# Patient Record
Sex: Female | Born: 1940 | Race: White | Hispanic: No | Marital: Married | Smoking: Never smoker
Health system: Southern US, Community
[De-identification: ages and names within clinical notes are randomized; demographics above are authoritative.]

## PROBLEM LIST (undated history)

## (undated) DIAGNOSIS — M069 Rheumatoid arthritis, unspecified: Secondary | ICD-10-CM

## (undated) DIAGNOSIS — E785 Hyperlipidemia, unspecified: Secondary | ICD-10-CM

## (undated) DIAGNOSIS — E291 Testicular hypofunction: Secondary | ICD-10-CM

## (undated) DIAGNOSIS — K589 Irritable bowel syndrome without diarrhea: Secondary | ICD-10-CM

## (undated) DIAGNOSIS — M109 Gout, unspecified: Secondary | ICD-10-CM

## (undated) DIAGNOSIS — N200 Calculus of kidney: Secondary | ICD-10-CM

## (undated) DIAGNOSIS — N400 Enlarged prostate without lower urinary tract symptoms: Secondary | ICD-10-CM

## (undated) DIAGNOSIS — J45909 Unspecified asthma, uncomplicated: Secondary | ICD-10-CM

## (undated) DIAGNOSIS — I10 Essential (primary) hypertension: Secondary | ICD-10-CM

## (undated) DIAGNOSIS — I5030 Unspecified diastolic (congestive) heart failure: Secondary | ICD-10-CM

## (undated) DIAGNOSIS — I2699 Other pulmonary embolism without acute cor pulmonale: Secondary | ICD-10-CM

## (undated) DIAGNOSIS — E559 Vitamin D deficiency, unspecified: Secondary | ICD-10-CM

## (undated) DIAGNOSIS — N183 Chronic kidney disease, stage 3 unspecified: Secondary | ICD-10-CM

## (undated) DIAGNOSIS — K219 Gastro-esophageal reflux disease without esophagitis: Secondary | ICD-10-CM

## (undated) HISTORY — DX: Rheumatoid arthritis, unspecified: M06.9

## (undated) HISTORY — DX: Unspecified asthma, uncomplicated: J45.909

## (undated) HISTORY — DX: Chronic kidney disease, stage 3 (moderate): N18.3

## (undated) HISTORY — DX: Testicular hypofunction: E29.1

## (undated) HISTORY — DX: Irritable bowel syndrome, unspecified: K58.9

## (undated) HISTORY — DX: Vitamin D deficiency, unspecified: E55.9

## (undated) HISTORY — DX: Unspecified diastolic (congestive) heart failure: I50.30

## (undated) HISTORY — DX: Essential (primary) hypertension: I10

## (undated) HISTORY — DX: Other pulmonary embolism without acute cor pulmonale: I26.99

## (undated) HISTORY — DX: Gastro-esophageal reflux disease without esophagitis: K21.9

## (undated) HISTORY — DX: Chronic kidney disease, stage 3 unspecified: N18.30

## (undated) HISTORY — DX: Benign prostatic hyperplasia without lower urinary tract symptoms: N40.0

## (undated) HISTORY — DX: Hyperlipidemia, unspecified: E78.5

## (undated) HISTORY — DX: Gout, unspecified: M10.9

## (undated) HISTORY — DX: Calculus of kidney: N20.0

---

## 1999-06-01 ENCOUNTER — Encounter: Admission: RE | Admit: 1999-06-01 | Discharge: 1999-06-01 | Payer: Self-pay | Admitting: Family Medicine

## 1999-06-01 ENCOUNTER — Encounter: Payer: Self-pay | Admitting: Family Medicine

## 2005-02-02 ENCOUNTER — Encounter: Admission: RE | Admit: 2005-02-02 | Discharge: 2005-02-02 | Payer: Self-pay | Admitting: Family Medicine

## 2005-11-02 ENCOUNTER — Other Ambulatory Visit: Admission: RE | Admit: 2005-11-02 | Discharge: 2005-11-02 | Payer: Self-pay | Admitting: Family Medicine

## 2006-12-25 ENCOUNTER — Encounter: Admission: RE | Admit: 2006-12-25 | Discharge: 2006-12-25 | Payer: Self-pay | Admitting: Family Medicine

## 2009-01-07 ENCOUNTER — Encounter: Admission: RE | Admit: 2009-01-07 | Discharge: 2009-01-07 | Payer: Self-pay | Admitting: Family Medicine

## 2009-01-29 ENCOUNTER — Encounter: Admission: RE | Admit: 2009-01-29 | Discharge: 2009-01-29 | Payer: Self-pay | Admitting: Family Medicine

## 2009-04-13 ENCOUNTER — Emergency Department (HOSPITAL_COMMUNITY): Admission: EM | Admit: 2009-04-13 | Discharge: 2009-04-13 | Payer: Self-pay | Admitting: Emergency Medicine

## 2010-07-04 ENCOUNTER — Encounter: Payer: Self-pay | Admitting: Family Medicine

## 2011-03-14 IMAGING — MG MM DIGITAL SCREENING BILAT W/ CAD
5 series · 5 of 5 positions shown · non-contrast
Comparison: none

DG SCREEN MAMMOGRAM BILATERAL
Bilateral CC and MLO view(s) were taken.

DIGITAL SCREENING MAMMOGRAM WITH CAD:
The breast tissue is heterogeneously dense.  No masses or malignant type calcifications are 
identified.  Compared with prior studies.
Images were processed with CAD.

[R CC]
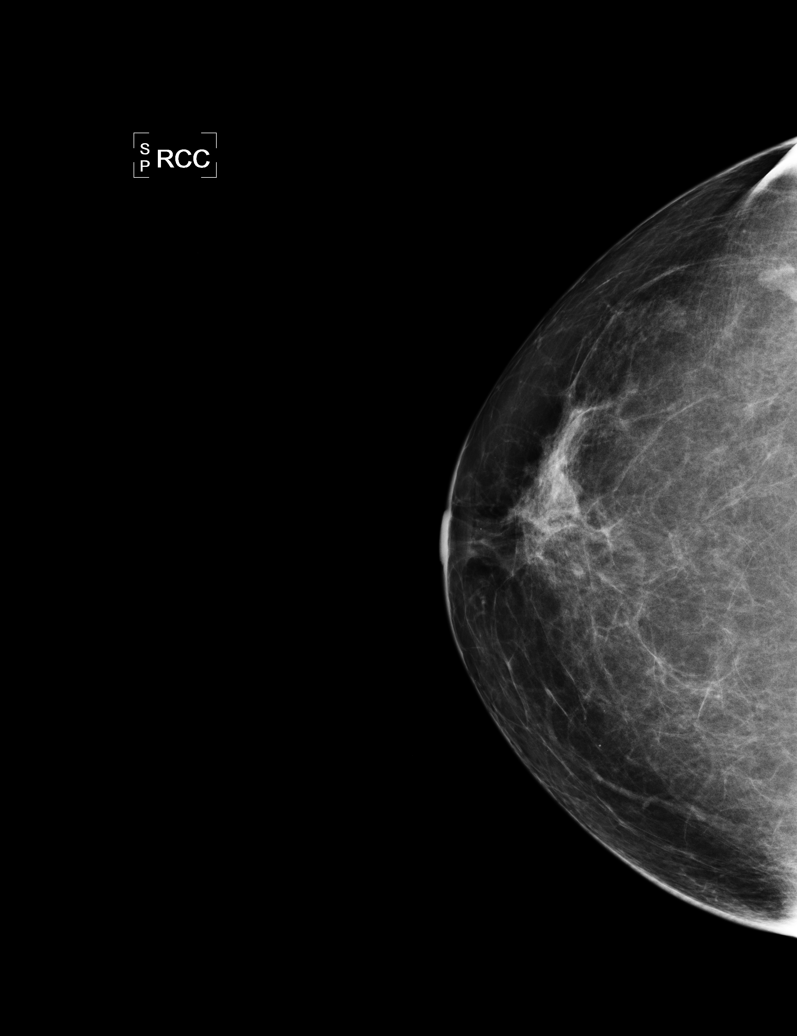

[L CC]
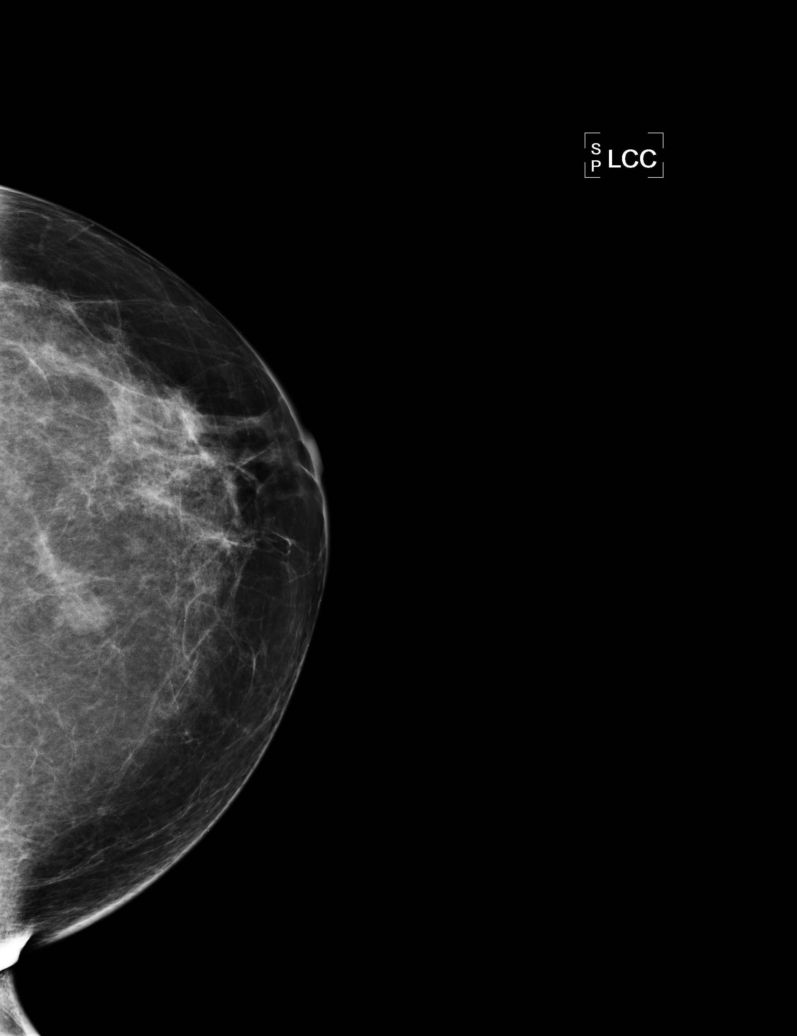

[L MLO]
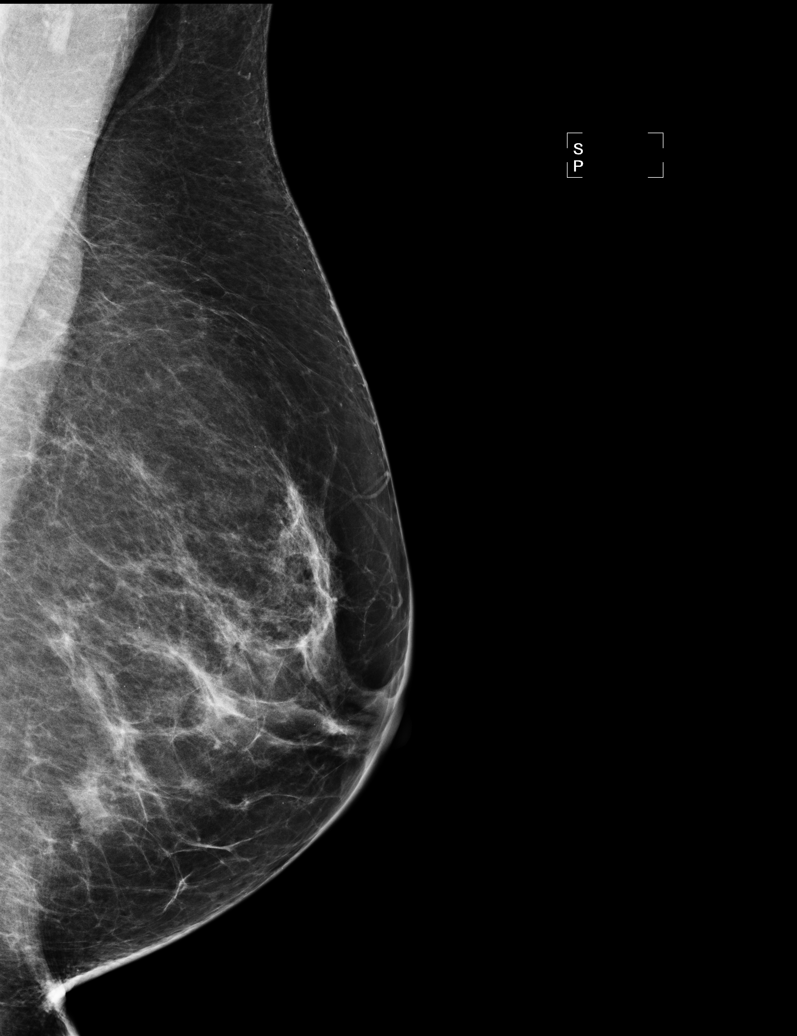

[R MLO]
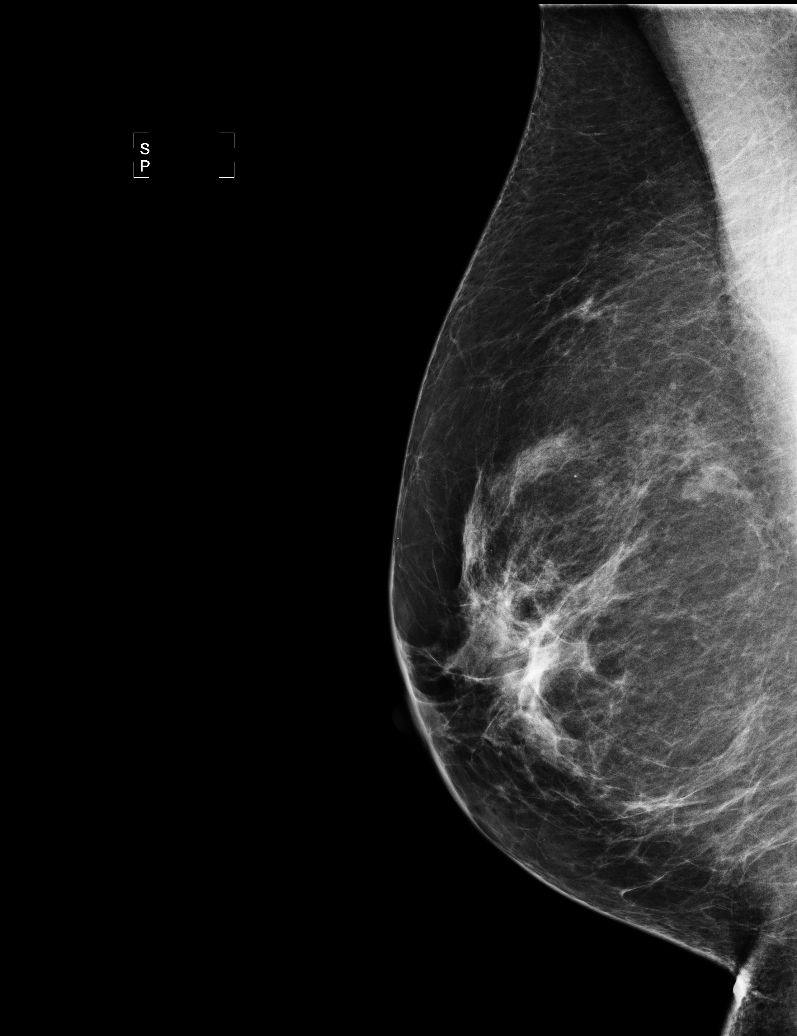

[L XCCL]
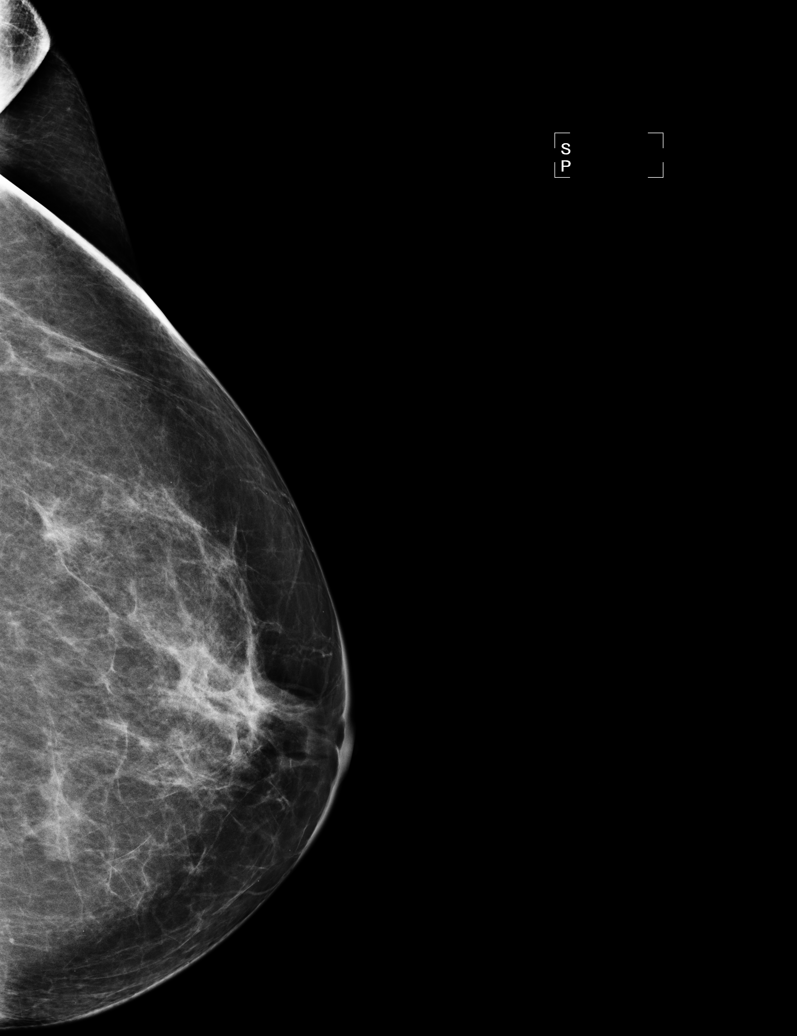

[5 of 5 positions shown; findings below may reference images not displayed]

IMPRESSION: No specific mammographic evidence of malignancy.  Next screening mammogram is recommended in one 
year.

A result letter of this screening mammogram will be mailed directly to the patient.

ASSESSMENT: Negative - BI-RADS 1

Screening mammogram in 1 year.
,

## 2018-04-24 ENCOUNTER — Encounter: Payer: Self-pay | Admitting: Cardiovascular Disease

## 2018-04-25 ENCOUNTER — Encounter (INDEPENDENT_AMBULATORY_CARE_PROVIDER_SITE_OTHER): Payer: Self-pay

## 2018-04-25 ENCOUNTER — Ambulatory Visit: Payer: Medicare HMO | Admitting: Cardiovascular Disease

## 2018-04-25 ENCOUNTER — Encounter: Payer: Self-pay | Admitting: Cardiovascular Disease

## 2018-04-25 VITALS — BP 182/94 | HR 53 | Ht 64.0 in | Wt 129.1 lb

## 2018-04-25 DIAGNOSIS — I10 Essential (primary) hypertension: Secondary | ICD-10-CM

## 2018-04-25 MED ORDER — TELMISARTAN 40 MG PO TABS: 40.0000 mg | ORAL_TABLET | Freq: Every day | ORAL | 11 refills | Status: DC

## 2018-04-25 NOTE — Patient Instructions (Signed)
Medication Instructions:  Your physician has recommended you make the following change in your medication:  1.) stop lisinopril 2.) start telmisartan (Micardis) 40 mg once a day at bedtime  If you need a refill on your cardiac medications before your next appointment, please call your pharmacy.   Lab work: bmet--in 3 weeks (with nurse visit) If you have labs (blood work) drawn today and your tests are completely normal, you will receive your results only by: Marland Kitchen. MyChart Message (if you have MyChart) OR . A paper copy in the mail If you have any lab test that is abnormal or we need to change your treatment, we will call you to review the results.  Testing/Procedures: none  Follow-Up: Your physician recommends that you schedule a follow-up appointment in: 3 weeks with nurse for blood pressure check and lab (BMET) Your physician recommends that you schedule a follow-up appointment in: 3 months with Dr. Elease HashimotoNahser.   Any Other Special Instructions Will Be Listed Below (If Applicable).

## 2018-04-25 NOTE — Progress Notes (Signed)
Cardiology Office Note:    Date:  04/25/2018   ID:  Trilby, Way 01-06-1941, MRN [id number]  PCP:  Lester [place], NP  Cardiologist:  New to [facility]  Electrophysiologist:  None   Referring MD: No ref. provider found   Problem List 1. Essential HTN  Chief Complaint  Patient presents with  . Hypertension     Nov. 14, 2019   Marie Raymond is a 77 y.o. female with a hx of HTN We were asked to see her today by Lester [place], NP for further eval of her HTN  BP has been quite variable .  BP is typically elevated in the AM Gets better as the day goes on  Is on Lisinopril 20 mg BID .   Has tried amlodipine and has tried HCTZ but did not tolerate them for some reason ( cannot remember why )  Watches her salt.   Exercises regularly ,.  Walks every day      Past Medical History:  Diagnosis Date  . Asthma   . Chronic kidney disease, stage III (moderate) (HCC)   . Diastolic heart failure (HCC)   . GERD (gastroesophageal reflux disease)   . Gout   . Hyperlipidemia   . Hypertension   . 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 Irritable bowel syndrome   . Nephrolithiasis   . Prostatic hypertrophy    BENIGN  . Pulmonary embolism (HCC)   . Rheumatoid arthritis (HCC)   . Vitamin D deficiency     History reviewed. No pertinent surgical history.  Current Medications: Current Meds  Medication Sig  . Calcium Carbonate-Vitamin D (OSCAL 500/200 D-3 PO) Take by mouth.  . Multiple Vitamin (MULTIVITAMIN) tablet Take 1 tablet by mouth daily.  . Multiple Vitamins-Minerals (PRESERVISION AREDS PO) Take by mouth.  . Omega-3 Fatty Acids (FISH OIL PO) Take by mouth.  . [DISCONTINUED] lisinopril (PRINIVIL,ZESTRIL) 20 MG tablet Take 20 mg by mouth 2 (two) times daily.     Allergies:   Ibuprofen; Amlodipine; Codeine; Hydrochlorothiazide; Nsaids; and Pneumococcal vaccines   Social History   Socioeconomic History  . Marital status: Married    Spouse name: Not on file  . Number of  children: Not on file  . Years of education: Not on file  . Highest education level: Not on file  Occupational History  . Not on file  Social Needs  . Financial resource strain: Not on file  . Food insecurity:    Worry: Not on file    Inability: Not on file  . Transportation needs:    Medical: Not on file    Non-medical: Not on file  Tobacco Use  . Smoking status: Never Smoker  . Smokeless tobacco: Never Used  Substance and Sexual Activity  . Alcohol use: Never    Frequency: Never  . Drug use: Never  . Sexual activity: Not on file  Lifestyle  . Physical activity:    Days per week: Not on file    Minutes per session: Not on file  . Stress: Not on file  Relationships  . Social connections:    Talks on phone: Not on file    Gets together: Not on file    Attends religious service: Not on file    Active member of club or organization: Not on file    Attends meetings of clubs or organizations: Not on file    Relationship status: Not on file  Other Topics Concern  . Not on file  Social History Narrative  . Not on file     Family History: The patient's family history includes Hypertension in her mother.  ROS:   Please see the history of present illness.     All other systems reviewed and are negative.  EKGs/Labs/Other Studies Reviewed:      EKG:   1114, 2019: Sinus bradycardia at a rate of 53.  Right bundle branch block.  No ST or T wave changes.  Recent Labs: No results found for requested labs within last 8760 hours.  Recent Lipid Panel No results found for: CHOL, TRIG, HDL, CHOLHDL, VLDL, LDLCALC, LDLDIRECT  Physical Exam:    VS:  BP (!) 182/94   Pulse (!) 53   Ht 5\' 4"  (1.626 m)   Wt 129 lb 1.9 oz (58.6 kg)   SpO2 98%   BMI 22.16 kg/m     Wt Readings from Last 3 Encounters:  04/25/18 129 lb 1.9 oz (58.6 kg)     GEN:  Well nourished, well developed in no acute distress HEENT: Normal NECK: No JVD; No carotid bruits LYMPHATICS: No  lymphadenopathy CARDIAC: RRR, no murmurs, rubs, gallops RESPIRATORY:  Clear to auscultation without rales, wheezing or rhonchi  ABDOMEN: Soft, non-tender, non-distended MUSCULOSKELETAL:  No edema; No deformity  SKIN: Warm and dry NEUROLOGIC:  Alert and oriented x 3 PSYCHIATRIC:  Normal affect   ASSESSMENT:    1. Essential hypertension    PLAN:    In order of problems listed above:  1. Essential hypertension: This point presents for further evaluation and management of her hypertension.  Her blood pressure readings have been quite variable.  I explained to her that this was actually normal physiology.  She does have some readings that are a bit on the high side.  I would like to discontinue the lisinopril and start her on Micardis (telmisartan) 40 mg at night   We will have her return in 3 weeks for a basic metabolic profile and a nurse visit blood pressure check.  I will see her in 3 months.   Medication Adjustments/Labs and Tests Ordered: Current medicines are reviewed at length with the patient today.  Concerns regarding medicines are outlined above.  Orders Placed This Encounter  Procedures  . Basic metabolic panel  . EKG 12-Lead   Meds ordered this encounter  Medications  . telmisartan (MICARDIS) 40 MG tablet    Sig: Take 1 tablet (40 mg total) by mouth at bedtime.    Dispense:  30 tablet    Refill:  11    Treatment change; dc lisinopril     Patient Instructions  Medication Instructions:  Your physician has recommended you make the following change in your medication:  1.) stop lisinopril 2.) start telmisartan (Micardis) 40 mg once a day at bedtime  If you need a refill on your cardiac medications before your next appointment, please call your pharmacy.   Lab work: bmet--in 3 weeks (with nurse visit) If you have labs (blood work) drawn today and your tests are completely normal, you will receive your results only by: Marland Kitchen MyChart Message (if you have MyChart)  OR . A paper copy in the mail If you have any lab test that is abnormal or we need to change your treatment, we will call you to review the results.  Testing/Procedures: none  Follow-Up: Your physician recommends that you schedule a follow-up appointment in: 3 weeks with nurse for blood pressure check and lab (BMET) Your physician recommends that you schedule  a follow-up appointment in: 3 months with Dr. Elease HashimotoNahser.   Any Other Special Instructions Will Be Listed Below (If Applicable).       Signed, Kristeen MissPhilip Marton Malizia, MD  04/25/2018 11:33 AM    [facility]

## 2018-05-16 ENCOUNTER — Other Ambulatory Visit: Payer: Medicare HMO | Admitting: *Deleted

## 2018-05-16 ENCOUNTER — Ambulatory Visit (INDEPENDENT_AMBULATORY_CARE_PROVIDER_SITE_OTHER): Payer: Medicare HMO | Admitting: *Deleted

## 2018-05-16 VITALS — BP 184/90 | HR 64 | Wt 131.8 lb

## 2018-05-16 DIAGNOSIS — Z0130 Encounter for examination of blood pressure without abnormal findings: Secondary | ICD-10-CM | POA: Diagnosis not present

## 2018-05-16 DIAGNOSIS — I10 Essential (primary) hypertension: Secondary | ICD-10-CM

## 2018-05-16 LAB — BASIC METABOLIC PANEL
BUN/Creatinine Ratio: 19 (ref 12–28)
BUN: 14 mg/dL (ref 8–27)
CALCIUM: 9 mg/dL (ref 8.7–10.3)
CHLORIDE: 102 mmol/L (ref 96–106)
CO2: 26 mmol/L (ref 20–29)
Creatinine, Ser: 0.72 mg/dL (ref 0.57–1.00)
GFR calc non Af Amer: 81 mL/min/{1.73_m2} (ref >59)
GFR, EST AFRICAN AMERICAN: 93 mL/min/{1.73_m2} (ref >59)
Glucose: 95 mg/dL (ref 65–99)
Potassium: 4.3 mmol/L (ref 3.5–5.2)
Sodium: 142 mmol/L (ref 134–144)

## 2018-05-16 MED ORDER — TELMISARTAN 80 MG PO TABS: 80.0000 mg | ORAL_TABLET | Freq: Every day | ORAL | 11 refills | Status: DC

## 2018-05-16 NOTE — Patient Instructions (Signed)
Medication Instructions:  Your physician has recommended you make the following change in your medication:  1.) increase telmisartan (Micardis) to 80 mg once a day (take at night)  If you need a refill on your cardiac medications before your next appointment, please call your pharmacy.   Lab work: none If you have labs (blood work) drawn today and your tests are completely normal, you will receive your results only by: Marland Kitchen. MyChart Message (if you have MyChart) OR . A paper copy in the mail If you have any lab test that is abnormal or we need to change your treatment, we will call you to review the results.  Testing/Procedures: None  Follow-Up:  Your physician recommends that you schedule a follow-up appointment in: 2 weeks for blood pressure check with the nurse.  Any Other Special Instructions Will Be Listed Below (If Applicable).

## 2018-05-16 NOTE — Progress Notes (Signed)
1.) Reason for visit: BP check  2.) Name of MD requesting visit: Dr. Elease HashimotoNahser  3.) H&P: at last OV 04/25/18 (new pt visit) BP was 182/94.  Lisinopril was stopped and telmisartan 40 mg was added.  4.) ROS related to problem: returns today for BP check and labs. BP today is 184/94, HR 60.  Pt denies fatigue, headache, blurry vision. .  5.) Assessment and plan per MD: Reviewed with Dr. Okey DupreEnd (DOD) who recommends increasing telmisartan to 80 mg once a day at bedtime, see what labs show today, and return in 2 weeks for BP check with nurse.  Pt verbalizes understanding and agreement with this plan.

## 2018-05-30 ENCOUNTER — Ambulatory Visit (INDEPENDENT_AMBULATORY_CARE_PROVIDER_SITE_OTHER): Payer: Medicare HMO

## 2018-05-30 VITALS — BP 122/76 | Ht 64.0 in | Wt 133.0 lb

## 2018-05-30 DIAGNOSIS — I10 Essential (primary) hypertension: Secondary | ICD-10-CM | POA: Diagnosis not present

## 2018-05-30 DIAGNOSIS — Z0130 Encounter for examination of blood pressure without abnormal findings: Secondary | ICD-10-CM

## 2018-05-30 NOTE — Progress Notes (Signed)
1.) Reason for visit: BP  2.) Name of MD requesting visit: Dr. Elease HashimotoNahser   3.) H&P: Hypertension  4.) ROS related to problem: Patient has felt great, no concerns, no incidents of fatigue.   5.) Assessment and plan per MD: Spoke with Dr. Eden EmmsNishan (DOD), he reviewed the patient's blood pressure values and he stated to continue current therapy.

## 2018-07-29 ENCOUNTER — Encounter: Payer: Self-pay | Admitting: Cardiovascular Disease

## 2018-07-29 ENCOUNTER — Ambulatory Visit: Payer: Medicare HMO | Admitting: Cardiovascular Disease

## 2018-07-29 VITALS — BP 125/85 | HR 54 | Ht 64.0 in | Wt 132.8 lb

## 2018-07-29 DIAGNOSIS — I10 Essential (primary) hypertension: Secondary | ICD-10-CM

## 2018-07-29 NOTE — Progress Notes (Signed)
Cardiology Office Note:    Date:  07/29/2018   ID:  Marie Raymond, Marie Raymond July 31, 1940, MRN [id number]  PCP:  Lester [place], NP  Cardiologist:  New to [place]  Electrophysiologist:  None   Referring MD: Lester [place], NP   Problem List 1. Essential HTN  Chief Complaint  Patient presents with  . Hypertension     Nov. 14, 2019   Marie Raymond is a 78 y.o. female with a hx of HTN We were asked to see her today by Lester [place], NP for further eval of her HTN  BP has been quite variable .  BP is typically elevated in the AM Gets better as the day goes on  Is on Lisinopril 20 mg BID .   Has tried amlodipine and has tried HCTZ but did not tolerate them for some reason ( cannot remember why )  Watches her salt.   Exercises regularly ,.  Walks every day    Feb. 17, 2020 :  Marie Raymond presents for further evaluation of her HTN Is doing well  BP is typically slightly elevated in the early am hours .  Exercises regularly .    Eats right.  ,  Avoids salt for the most part.    Past Medical History:  Diagnosis Date  . Asthma   . Chronic kidney disease, stage III (moderate) (HCC)   . Diastolic heart failure (HCC)   . GERD (gastroesophageal reflux disease)   . Gout   . Hyperlipidemia   . Hypertension   . 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 Irritable bowel syndrome   . Nephrolithiasis   . Prostatic hypertrophy    BENIGN  . Pulmonary embolism (HCC)   . Rheumatoid arthritis (HCC)   . Vitamin D deficiency     History reviewed. No pertinent surgical history.  Current Medications: Current Meds  Medication Sig  . Calcium Carbonate-Vitamin D (OSCAL 500/200 D-3 PO) Take by mouth.  . Multiple Vitamin (MULTIVITAMIN) tablet Take 1 tablet by mouth daily.  . Multiple Vitamins-Minerals (PRESERVISION AREDS PO) Take by mouth.  . Omega-3 Fatty Acids (FISH OIL PO) Take by mouth.  . telmisartan (MICARDIS) 80 MG tablet Take 1 tablet (80 mg total) by mouth daily.     Allergies:   Ibuprofen;  Amlodipine; Codeine; Hydrochlorothiazide; Nsaids; and Pneumococcal vaccines   Social History   Socioeconomic History  . Marital status: Married    Spouse name: Not on file  . Number of children: Not on file  . Years of education: Not on file  . Highest education level: Not on file  Occupational History  . Not on file  Social Needs  . Financial resource strain: Not on file  . Food insecurity:    Worry: Not on file    Inability: Not on file  . Transportation needs:    Medical: Not on file    Non-medical: Not on file  Tobacco Use  . Smoking status: Never Smoker  . Smokeless tobacco: Never Used  Substance and Sexual Activity  . Alcohol use: Never    Frequency: Never  . Drug use: Never  . Sexual activity: Not on file  Lifestyle  . Physical activity:    Days per week: Not on file    Minutes per session: Not on file  . Stress: Not on file  Relationships  . Social connections:    Talks on phone: Not on file    Gets together: Not on file    Attends religious  service: Not on file    Active member of club or organization: Not on file    Attends meetings of clubs or organizations: Not on file    Relationship status: Not on file  Other Topics Concern  . Not on file  Social History Narrative  . Not on file     Family History: The patient's family history includes Hypertension in her mother.  ROS:   Please see the history of present illness.     All other systems reviewed and are negative.  EKGs/Labs/Other Studies Reviewed:      EKG:   1114, 2019: Sinus bradycardia at a rate of 53.  Right bundle branch block.  No ST or T wave changes.  Recent Labs: 05/16/2018: BUN 14; Creatinine, Ser 0.72; Potassium 4.3; Sodium 142  Recent Lipid Panel No results found for: CHOL, TRIG, HDL, CHOLHDL, VLDL, LDLCALC, LDLDIRECT   Physical Exam: Blood pressure 125/85, pulse (!) 54, height 5\' 4"  (1.626 m), weight 132 lb 12.8 oz (60.2 kg), SpO2 99 %.  GEN:  Well nourished, well developed  in no acute distress HEENT: Normal NECK: No JVD; No carotid bruits LYMPHATICS: No lymphadenopathy CARDIAC: RRR  RESPIRATORY:  Clear to auscultation without rales, wheezing or rhonchi  ABDOMEN: Soft, non-tender, non-distended MUSCULOSKELETAL:  No edema; No deformity  SKIN: Warm and dry NEUROLOGIC:  Alert and oriented x 3   ASSESSMENT:    No diagnosis found. PLAN:    In order of problems listed above:  1. Essential hypertension:   This program seems to be doing well.  Her blood pressure is fairly well controlled.  Continue telmisartan.  She seems to be doing very well.  I will see her on an as-needed basis.     Medication Adjustments/Labs and Tests Ordered: Current medicines are reviewed at length with the patient today.  Concerns regarding medicines are outlined above.  No orders of the defined types were placed in this encounter.  No orders of the defined types were placed in this encounter.    There are no Patient Instructions on file for this visit.   Signed, Kristeen Miss, MD  07/29/2018 10:16 AM    [facility]

## 2018-07-29 NOTE — Patient Instructions (Signed)
Medication Instructions:  Your physician recommends that you continue on your current medications as directed. Please refer to the Current Medication list given to you today.  If you need a refill on your cardiac medications before your next appointment, please call your pharmacy.   Lab work: None Ordered    Testing/Procedures: None Ordered    Follow-Up: At [facility], you and your health needs are our priority.  As part of our continuing mission to provide you with exceptional heart care, we have created designated Provider Care Teams.  These Care Teams include your primary Cardiologist (physician) and Advanced Practice Providers (APPs -  Physician Assistants and Nurse Practitioners) who all work together to provide you with the care you need, when you need it. You will need a follow up appointment in:   as needed.  Please call our office 2 months in advance to schedule this appointment.  You may see Dr. [place] or one of the following Advanced Practice Providers on your designated Care Team: Scott Weaver, PA-C Vin Bhagat, PA-C . Janine Hammond, NP   

## 2019-06-04 ENCOUNTER — Other Ambulatory Visit: Payer: Self-pay | Admitting: Cardiovascular Disease

## 2019-06-05 ENCOUNTER — Other Ambulatory Visit: Payer: Self-pay | Admitting: *Deleted

## 2019-06-05 MED ORDER — TELMISARTAN 80 MG PO TABS: 80.0000 mg | ORAL_TABLET | Freq: Every day | ORAL | 0 refills | Status: DC

## 2019-09-02 ENCOUNTER — Other Ambulatory Visit: Payer: Self-pay | Admitting: Cardiovascular Disease

## 2020-07-21 ENCOUNTER — Telehealth: Payer: Self-pay | Admitting: Cardiovascular Disease

## 2020-07-21 NOTE — Telephone Encounter (Signed)
Per patient husband who was in for a follow up appointment to see Dr. Shari Prows today  stated that Dr. Elease Hashimoto had released his wife and she need to follow up with her primary care.   She is now having problems and he want her to see Dr. Shari Prows.   Explained to patient that we don't have a note stating the above and I will need to send a note to both MD's to agree to the switch.

## 2020-07-22 NOTE — Telephone Encounter (Signed)
Okay with me 

## 2020-07-22 NOTE — Telephone Encounter (Signed)
I am fine with having the patient see Dr. Shari Prows.

## 2020-07-30 NOTE — Progress Notes (Signed)
Cardiology Office Note:    Date:  08/02/2020   ID:  Marie Raymond, Marie Raymond 07/26/1940, MRN [id number]  PCP:  Lester [place], NP   [facility]  Cardiologist:  Kristeen Miss, MD  Advanced Practice Provider:  No care team member to display Electrophysiologist:  None   Referring MD: Lester Nimmons, NP    History of Present Illness:    Marie Raymond is a 80 y.o. female with a hx of asthma, CKD III, HTN, HLD, rheumatoid arthritis, and GERD who presents to clinic for follow-up of HTN.  The patient was last seen by Dr. Elease Hashimoto in 07/2018 for blood pressure. At that time, she was doing well with her medications, exercise and diet and no changes were made.   Patient states that her blood pressure has been running high for years. Has been on medication for the past 5 years or so. She has noted that over the past several months, her been running high at home in 170s/90s on home cuff. Occasionally has head pressure with this. Having more fatigue at night but is very active during the day. Currently takes her telmisartan 80mg  at night. No chest pain with exertion, DOE, LE edema. Walks everyday for at least everyday. Adheres to a healthy diet and avoids salt.   BMET looks normal. LDL 99, HDL53, TG 130, TC  Past Medical History:  Diagnosis Date  . Asthma   . Chronic kidney disease, stage III (moderate) (HCC)   . Diastolic heart failure (HCC)   . GERD (gastroesophageal reflux disease)   . Gout   . Hyperlipidemia   . Hypertension   . 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 Irritable bowel syndrome   . Nephrolithiasis   . Prostatic hypertrophy    BENIGN  . Pulmonary embolism (HCC)   . Rheumatoid arthritis (HCC)   . Vitamin D deficiency     No past surgical history on file.  Current Medications: Current Meds  Medication Sig  . Calcium Carbonate-Vitamin D (OSCAL 500/200 D-3 PO) Take 2 tablets by mouth daily.  . Multiple Vitamin (MULTIVITAMIN) tablet Take 1 tablet  by mouth daily.  . Multiple Vitamins-Minerals (PRESERVISION AREDS PO) Take 2 tablets by mouth daily.  . Omega-3 Fatty Acids (FISH OIL PO) Take 2 tablets by mouth daily.  235 spironolactone (ALDACTONE) 25 MG tablet Take 1 tablet (25 mg total) by mouth daily.  Marland Kitchen telmisartan (MICARDIS) 80 MG tablet Take 1 tablet (80 mg total) by mouth daily. Further refills to Primary Care Provider     Allergies:   Ibuprofen, Amlodipine, Codeine, Hydrochlorothiazide, Nsaids, and Pneumococcal vaccines   Social History   Socioeconomic History  . Marital status: Married    Spouse name: Not on file  . Number of children: Not on file  . Years of education: Not on file  . Highest education level: Not on file  Occupational History  . Not on file  Tobacco Use  . Smoking status: Never Smoker  . Smokeless tobacco: Never Used  Vaping Use  . Vaping Use: Never used  Substance and Sexual Activity  . Alcohol use: Never  . Drug use: Never  . Sexual activity: Not on file  Other Topics Concern  . Not on file  Social History Narrative  . Not on file   Social Determinants of Health   Financial Resource Strain: Not on file  Food Insecurity: Not on file  Transportation Needs: Not on file  Physical Activity: Not on file  Stress: Not on file  Social Connections: Not on file     Family History: The patient's family history includes Hypertension in her mother.  ROS:   Please see the history of present illness.    Review of Systems  Constitutional: Negative for chills, fever and malaise/fatigue.  HENT: Negative for nosebleeds.   Eyes: Negative for blurred vision and redness.  Respiratory: Negative for shortness of breath.   Cardiovascular: Negative for chest pain, palpitations, orthopnea, claudication, leg swelling and PND.  Gastrointestinal: Negative for nausea and vomiting.  Genitourinary: Negative for dysuria.  Musculoskeletal: Positive for joint pain. Negative for falls.  Neurological: Positive for loss  of consciousness. Negative for dizziness.  Endo/Heme/Allergies: Negative for polydipsia.  Psychiatric/Behavioral: Negative for substance abuse.    EKGs/Labs/Other Studies Reviewed:    The following studies were reviewed today: No cardiac studies in system  EKG:  EKG is  ordered today.  The ekg ordered today demonstrates sinus bradycardia with RBBB. HR 53.  Recent Labs: No results found for requested labs within last 8760 hours.  Recent Lipid Panel No results found for: CHOL, TRIG, HDL, CHOLHDL, VLDL, LDLCALC, LDLDIRECT    Physical Exam:    VS:  BP (!) 164/80 (BP Location: Right Arm, Patient Position: Sitting)   Pulse (!) 53   Ht 5\' 4"  (1.626 m)   Wt 128 lb 12.8 oz (58.4 kg)   SpO2 95%   BMI 22.11 kg/m     Wt Readings from Last 3 Encounters:  08/02/20 128 lb 12.8 oz (58.4 kg)  07/29/18 132 lb 12.8 oz (60.2 kg)  05/30/18 133 lb (60.3 kg)     GEN:  Well nourished, well developed in no acute distress HEENT: Normal NECK: No JVD; No carotid bruits CARDIAC: Bradycardic, regular, no murmurs, rubs, gallops RESPIRATORY:  Clear to auscultation without rales, wheezing or rhonchi  ABDOMEN: Soft, non-tender, non-distended MUSCULOSKELETAL:  No edema; No deformity  SKIN: Warm and dry NEUROLOGIC:  Alert and oriented x 3 PSYCHIATRIC:  Normal affect   ASSESSMENT:    1. Benign essential blepharospasm   2. Benign essential hypertension   3. Mixed hyperlipidemia    PLAN:    In order of problems listed above:  #HTN: Poorly controlled at home with systolic 170s. Correlates with blood pressure obtained today. Has multiple allergies to blood pressure medications including amlodipine and HCTZ. Cannot tolerate BB due to bradycardia. -Continue telmisartan 80mg  daily -Start spironolactone 25mg  daily -Repeat BMET next week -Keep blood pressure log for 06/01/18 to review -On low Na diet  #HLD: LDL 99. Not on medications. Diet controlled. -Diet controlled   Medication Adjustments/Labs  and Tests Ordered: Current medicines are reviewed at length with the patient today.  Concerns regarding medicines are outlined above.  Orders Placed This Encounter  Procedures  . Basic metabolic panel  . EKG 12-Lead   Meds ordered this encounter  Medications  . spironolactone (ALDACTONE) 25 MG tablet    Sig: Take 1 tablet (25 mg total) by mouth daily.    Dispense:  90 tablet    Refill:  3    Patient Instructions  Medication Instructions:  Your physician has recommended you make the following change in your medication:  1.  START Spironolactone 25 taking 1 tablet daily   *If you need a refill on your cardiac medications before your next appointment, please call your pharmacy*   Lab Work: 1 WEEK: Go to a [facility] for:  BMET  If you have labs (blood work) drawn today and  your tests are completely normal, you will receive your results only by: Marland Kitchen MyChart Message (if you have MyChart) OR . A paper copy in the mail If you have any lab test that is abnormal or we need to change your treatment, we will call you to review the results.   Testing/Procedures: None ordered   Follow-Up: At [facility], you and your health needs are our priority.  As part of our continuing mission to provide you with exceptional heart care, we have created designated Provider Care Teams.  These Care Teams include your primary Cardiologist (physician) and Advanced Practice Providers (APPs -  Physician Assistants and Nurse Practitioners) who all work together to provide you with the care you need, when you need it.  We recommend signing up for the patient portal called "MyChart".  Sign up information is provided on this After Visit Summary.  MyChart is used to connect with patients for Virtual Visits (Telemedicine).  Patients are able to view lab/test results, encounter notes, upcoming appointments, etc.  Non-urgent messages can be sent to your provider as well.   To learn more about what you can do with  MyChart, go to [email or web address].    Your next appointment:   1 month(s)  The format for your next appointment:   In Person  Provider:   Laurance Flatten, MD   Other Instructions      Signed, Meriam Sprague, MD  08/02/2020 11:08 AM    [facility]

## 2020-08-02 ENCOUNTER — Other Ambulatory Visit: Payer: Self-pay

## 2020-08-02 ENCOUNTER — Ambulatory Visit: Payer: Medicare PPO | Admitting: Cardiology

## 2020-08-02 ENCOUNTER — Encounter: Payer: Self-pay | Admitting: Cardiology

## 2020-08-02 ENCOUNTER — Other Ambulatory Visit: Payer: Self-pay | Admitting: *Deleted

## 2020-08-02 VITALS — BP 164/80 | HR 53 | Ht 64.0 in | Wt 128.8 lb

## 2020-08-02 DIAGNOSIS — E782 Mixed hyperlipidemia: Secondary | ICD-10-CM

## 2020-08-02 DIAGNOSIS — G245 Blepharospasm: Secondary | ICD-10-CM

## 2020-08-02 DIAGNOSIS — I10 Essential (primary) hypertension: Secondary | ICD-10-CM | POA: Diagnosis not present

## 2020-08-02 MED ORDER — SPIRONOLACTONE 25 MG PO TABS: 25.0000 mg | ORAL_TABLET | Freq: Every day | ORAL | 3 refills | Status: DC

## 2020-08-02 NOTE — Patient Instructions (Addendum)
Medication Instructions:  Your physician has recommended you make the following change in your medication:  1.  START Spironolactone 25 taking 1 tablet daily   *If you need a refill on your cardiac medications before your next appointment, please call your pharmacy*   Lab Work: 1 WEEK: Go to a [facility] for:  BMET  If you have labs (blood work) drawn today and your tests are completely normal, you will receive your results only by: Marland Kitchen MyChart Message (if you have MyChart) OR . A paper copy in the mail If you have any lab test that is abnormal or we need to change your treatment, we will call you to review the results.   Testing/Procedures: None ordered   Follow-Up: At [facility], you and your health needs are our priority.  As part of our continuing mission to provide you with exceptional heart care, we have created designated Provider Care Teams.  These Care Teams include your primary Cardiologist (physician) and Advanced Practice Providers (APPs -  Physician Assistants and Nurse Practitioners) who all work together to provide you with the care you need, when you need it.  We recommend signing up for the patient portal called "MyChart".  Sign up information is provided on this After Visit Summary.  MyChart is used to connect with patients for Virtual Visits (Telemedicine).  Patients are able to view lab/test results, encounter notes, upcoming appointments, etc.  Non-urgent messages can be sent to your provider as well.   To learn more about what you can do with MyChart, go to [email or web address].    Your next appointment:   1 month(s)  The format for your next appointment:   In Person  Provider:   Laurance Flatten, MD   Other Instructions

## 2020-08-10 ENCOUNTER — Telehealth: Payer: Self-pay | Admitting: *Deleted

## 2020-08-10 LAB — BASIC METABOLIC PANEL
BUN/Creatinine Ratio: 19 (ref 12–28)
BUN: 20 mg/dL (ref 8–27)
CO2: 26 mmol/L (ref 20–29)
Calcium: 9.5 mg/dL (ref 8.7–10.3)
Chloride: 102 mmol/L (ref 96–106)
Creatinine, Ser: 1.03 mg/dL — ABNORMAL HIGH (ref 0.57–1.00)
Glucose: 108 mg/dL — ABNORMAL HIGH (ref 65–99)
Potassium: 4.8 mmol/L (ref 3.5–5.2)
Sodium: 142 mmol/L (ref 134–144)
eGFR: 55 mL/min/{1.73_m2} — ABNORMAL LOW (ref >59)

## 2020-08-10 NOTE — Telephone Encounter (Signed)
Spoke with pt and reviewed results.  Pt provided BPs since visit:  2/22- 172/79 AM, 147/60 Afternoon, 142/75 HS 2/23- 142/71 AM, 140/74 Afternoon, 147/77 HS 2/24- 158/77 AM, 149/80 Afternoon, 141/72 HS 2/25- 166/84 AM, 158/75 Afternoon, 140/68 HS 2/26- 153/72 AM, 153/75 Afternoon, 159/80 HS 2/27- 156/79 AM, 153/76 Afternoon, 152/73 HS 2/28- 159/76 AM, 151/75 Afternoon, 146/70 HS  Advised SBP still a little more elevated than we like and I will send to Dr. Shari Prows for review.

## 2020-08-10 NOTE — Telephone Encounter (Signed)
-----   Message from Meriam Sprague, MD sent at 08/10/2020 12:42 PM EST ----- Labs look overall stable. Slight increase in Cr which is expected with spironolactone. How is her blood pressure doing?

## 2020-08-19 NOTE — Telephone Encounter (Signed)
Blood pressures are still high. Unfortunately, due to her allergies, our options in terms of blood pressure medications are limited. Is she willing to take a three times a day medication? If so, can we start her on hydralazine 25mg  TID.  Thank you!

## 2020-08-24 MED ORDER — HYDRALAZINE HCL 25 MG PO TABS: 25.0000 mg | ORAL_TABLET | Freq: Three times a day (TID) | ORAL | 11 refills | Status: DC

## 2020-08-24 NOTE — Telephone Encounter (Signed)
Called patient with Dr. Devin Going recommendations. Patient verbalized understanding and will start taking hydralazine 25 mg TID.

## 2020-08-29 NOTE — Progress Notes (Signed)
Cardiology Office Note:    Date:  08/31/2020   ID:  Catie, Chiao 05-18-1941, MRN [id number]  PCP:  Lester [place], NP   [facility]  Cardiologist:  Kristeen Miss, MD  Advanced Practice Provider:  No care team member to display Electrophysiologist:  None   Referring MD: Lester [place], NP    History of Present Illness:    Marie Raymond is a 80 y.o. female with a hx of asthma, CKD III, HTN, HLD, rheumatoid arthritis, and GERD who presents to clinic for follow-up of HTN.  The patient initially seen by Dr. Elease Hashimoto in 07/2018 for blood pressure. At that time, she was doing well with her medications, exercise and diet and no changes were made.   She returned to clinic 08/02/20 to see me due to labile blood pressures at home with Bps running as high 170s/90s. We started spironolactone in addition to telmisartan at that time. Blood pressure log with persistently high readings in 140s and hydralazine added (has multiple medication intolerances). Now returning for follow-up.  Patient states that she feels a bit more tired since starting the hydralazine. Has occasional headache but that has since improved. Blood pressure is much better controlled at home 120-140s, but she is wondering if there is any adjustments in the medications that can be made to make her feel less tired. No chest pain, SOB, LE edema, PND, or orthopnea. Adheres to low salt diet. Remains active and walks daily with no exertional symptoms.  Past Medical History:  Diagnosis Date  . Asthma   . Chronic kidney disease, stage III (moderate) (HCC)   . Diastolic heart failure (HCC)   . GERD (gastroesophageal reflux disease)   . Gout   . Hyperlipidemia   . Hypertension   . 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 Irritable bowel syndrome   . Nephrolithiasis   . Prostatic hypertrophy    BENIGN  . Pulmonary embolism (HCC)   . Rheumatoid arthritis (HCC)   . Vitamin D deficiency     History reviewed. No  pertinent surgical history.  Current Medications: Current Meds  Medication Sig  . Calcium Carbonate-Vitamin D (OSCAL 500/200 D-3 PO) Take 2 tablets by mouth daily.  . Multiple Vitamin (MULTIVITAMIN) tablet Take 1 tablet by mouth daily.  . Multiple Vitamins-Minerals (PRESERVISION AREDS PO) Take 2 tablets by mouth daily.  . Omega-3 Fatty Acids (FISH OIL PO) Take 2 tablets by mouth daily.  Marland Kitchen telmisartan (MICARDIS) 80 MG tablet Take 1 tablet (80 mg total) by mouth daily. Further refills to Primary Care Provider  . [DISCONTINUED] hydrALAZINE (APRESOLINE) 25 MG tablet Take 1 tablet (25 mg total) by mouth 3 (three) times daily.  . [DISCONTINUED] spironolactone (ALDACTONE) 25 MG tablet Take 1 tablet (25 mg total) by mouth daily.     Allergies:   Ibuprofen, Amlodipine, Codeine, Hydrochlorothiazide, Nsaids, and Pneumococcal vaccines   Social History   Socioeconomic History  . Marital status: Married    Spouse name: Not on file  . Number of children: Not on file  . Years of education: Not on file  . Highest education level: Not on file  Occupational History  . Not on file  Tobacco Use  . Smoking status: Never Smoker  . Smokeless tobacco: Never Used  Vaping Use  . Vaping Use: Never used  Substance and Sexual Activity  . Alcohol use: Never  . Drug use: Never  . Sexual activity: Not on file  Other Topics Concern  .  Not on file  Social History Narrative  . Not on file   Social Determinants of Health   Financial Resource Strain: Not on file  Food Insecurity: Not on file  Transportation Needs: Not on file  Physical Activity: Not on file  Stress: Not on file  Social Connections: Not on file     Family History: The patient's family history includes Hypertension in her mother.  ROS:   Please see the history of present illness.    Review of Systems  Constitutional: Positive for malaise/fatigue. Negative for chills and fever.  HENT: Negative for hearing loss.   Eyes: Negative for  blurred vision and redness.  Respiratory: Negative for shortness of breath.   Cardiovascular: Negative for chest pain, palpitations, orthopnea, claudication, leg swelling and PND.  Gastrointestinal: Negative for heartburn, melena and nausea.  Genitourinary: Negative for dysuria.  Musculoskeletal: Negative for falls and myalgias.  Neurological: Negative for dizziness and loss of consciousness.  Endo/Heme/Allergies: Negative for polydipsia.  Psychiatric/Behavioral: Negative for substance abuse.    EKGs/Labs/Other Studies Reviewed:    The following studies were reviewed today: No cardiac studies  Recent Labs: 08/09/2020: BUN 20; Creatinine, Ser 1.03; Potassium 4.8; Sodium 142  Recent Lipid Panel No results found for: CHOL, TRIG, HDL, CHOLHDL, VLDL, LDLCALC, LDLDIRECT    Physical Exam:    VS:  BP (!) 150/80   Pulse 65   Ht 5\' 4"  (1.626 m)   Wt 129 lb 3.2 oz (58.6 kg)   SpO2 99%   BMI 22.18 kg/m     Wt Readings from Last 3 Encounters:  08/31/20 129 lb 3.2 oz (58.6 kg)  08/02/20 128 lb 12.8 oz (58.4 kg)  07/29/18 132 lb 12.8 oz (60.2 kg)     GEN:  Well nourished, well developed in no acute distress HEENT: Normal NECK: No JVD; No carotid bruits CARDIAC: RRR, no murmurs, rubs, gallops RESPIRATORY:  Clear to auscultation without rales, wheezing or rhonchi  ABDOMEN: Soft, non-tender, non-distended MUSCULOSKELETAL:  No edema; No deformity  SKIN: Warm and dry NEUROLOGIC:  Alert and oriented x 3 PSYCHIATRIC:  Normal affect   ASSESSMENT:    1. Hypertension, unspecified type   2. Mixed hyperlipidemia    PLAN:    In order of problems listed above:  #HTN: Better controlled. Has multiple allergies to blood pressure medications including amlodipine and HCTZ. Cannot tolerate BB due to bradycardia. Hydralazine has made her feel tired with occasional HA.  -Continue telmisartan 80mg  in the morning -Increase spironolactone to 25mg  in the AM (BP is highest in late morning and  afternoon) and 25mg  in the evening -Check BMET next week as may not be able to tolerate higher spiro dose due to K -Stop hydralazine for now due to fatigue and HA; willing to re-trial if spironolactone does not work -Keep blood pressure log for 07/31/18 to review -On low Na diet  #HLD: LDL 99. Not on medications. Diet controlled. -Diet controlled   Medication Adjustments/Labs and Tests Ordered: Current medicines are reviewed at length with the patient today.  Concerns regarding medicines are outlined above.  Orders Placed This Encounter  Procedures  . Basic metabolic panel   Meds ordered this encounter  Medications  . spironolactone (ALDACTONE) 25 MG tablet    Sig: Take 1 tablet (25 mg total) by mouth 2 (two) times daily.    Dispense:  180 tablet    Refill:  3    Patient Instructions  Medication Instructions:  1) STOP HYDRALAZINE 2) TAKE SPIRONOLACTONE  25 mg twice daily *If you need a refill on your cardiac medications before your next appointment, please call your pharmacy*  Lab Work: Please have labs drawn in about 1 week. You do NOT need to be fasting.  Follow-Up: Your provider recommends that you schedule a follow-up appointment in 4 weeks.    Signed, Meriam Sprague, MD  08/31/2020 1:08 PM    [facility]

## 2020-08-31 ENCOUNTER — Ambulatory Visit: Payer: Medicare PPO | Admitting: Cardiology

## 2020-08-31 ENCOUNTER — Other Ambulatory Visit: Payer: Self-pay

## 2020-08-31 ENCOUNTER — Encounter: Payer: Self-pay | Admitting: Cardiology

## 2020-08-31 VITALS — BP 150/80 | HR 65 | Ht 64.0 in | Wt 129.2 lb

## 2020-08-31 DIAGNOSIS — I10 Essential (primary) hypertension: Secondary | ICD-10-CM

## 2020-08-31 DIAGNOSIS — E782 Mixed hyperlipidemia: Secondary | ICD-10-CM

## 2020-08-31 MED ORDER — SPIRONOLACTONE 25 MG PO TABS: 25.0000 mg | ORAL_TABLET | Freq: Two times a day (BID) | ORAL | 3 refills | Status: DC

## 2020-08-31 NOTE — Patient Instructions (Signed)
Medication Instructions:  1) STOP HYDRALAZINE 2) TAKE SPIRONOLACTONE  25 mg twice daily *If you need a refill on your cardiac medications before your next appointment, please call your pharmacy*  Lab Work: Please have labs drawn in about 1 week. You do NOT need to be fasting.  Follow-Up: Your provider recommends that you schedule a follow-up appointment in 4 weeks.

## 2020-09-08 LAB — BASIC METABOLIC PANEL
BUN/Creatinine Ratio: 23 (ref 12–28)
BUN: 25 mg/dL (ref 8–27)
CO2: 25 mmol/L (ref 20–29)
Calcium: 9.5 mg/dL (ref 8.7–10.3)
Chloride: 98 mmol/L (ref 96–106)
Creatinine, Ser: 1.11 mg/dL — ABNORMAL HIGH (ref 0.57–1.00)
Glucose: 198 mg/dL — ABNORMAL HIGH (ref 65–99)
Potassium: 4.5 mmol/L (ref 3.5–5.2)
Sodium: 138 mmol/L (ref 134–144)
eGFR: 51 mL/min/{1.73_m2} — ABNORMAL LOW (ref >59)

## 2020-09-25 NOTE — Progress Notes (Deleted)
Cardiology Office Note:    Date:  09/25/2020   ID:  Marie Raymond, Marie Raymond 06/13/1940, MRN [id number]  PCP:  Lester [place], NP   [facility]  Cardiologist:  Kristeen Miss, MD  Advanced Practice Provider:  No care team member to display Electrophysiologist:  None   Referring MD: Lester [place], NP    History of Present Illness:    Marie Raymond is a 80 y.o. female with a hx of asthma, CKD III, HTN, HLD, rheumatoid arthritis, and GERD who presents to clinic for follow-up of HTN.  The patient initially seen by Dr. Elease Hashimoto in 07/2018 for blood pressure. At that time, she was doing well with her medications, exercise and diet and no changes were made.   She returned to clinic 08/02/20 to see me due to labile blood pressures at home with Bps running as high 170s/90s. We started spironolactone in addition to telmisartan at that time. Blood pressure log with persistently high readings in 140s and hydralazine added (has multiple medication intolerances). Now returning for follow-up.  During last visit on 08/31/20, the patient was complaining of fatigue on hydralazine. Blood pressures were better controlled but she was hoping to trial off the medication. We increased her spirolactone to 25mg  BID and stopped the hydralazine. She now returns to clinic.  Today,  Past Medical History:  Diagnosis Date  . Asthma   . Chronic kidney disease, stage III (moderate) (HCC)   . Diastolic heart failure (HCC)   . GERD (gastroesophageal reflux disease)   . Gout   . Hyperlipidemia   . Hypertension   . 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 Irritable bowel syndrome   . Nephrolithiasis   . Prostatic hypertrophy    BENIGN  . Pulmonary embolism (HCC)   . Rheumatoid arthritis (HCC)   . Vitamin D deficiency     No past surgical history on file.  Current Medications: No outpatient medications have been marked as taking for the 09/30/20 encounter (Appointment) with 10/02/20, MD.      Allergies:   Ibuprofen, Amlodipine, Codeine, Hydrochlorothiazide, Nsaids, and Pneumococcal vaccines   Social History   Socioeconomic History  . Marital status: Married    Spouse name: Not on file  . Number of children: Not on file  . Years of education: Not on file  . Highest education level: Not on file  Occupational History  . Not on file  Tobacco Use  . Smoking status: Never Smoker  . Smokeless tobacco: Never Used  Vaping Use  . Vaping Use: Never used  Substance and Sexual Activity  . Alcohol use: Never  . Drug use: Never  . Sexual activity: Not on file  Other Topics Concern  . Not on file  Social History Narrative  . Not on file   Social Determinants of Health   Financial Resource Strain: Not on file  Food Insecurity: Not on file  Transportation Needs: Not on file  Physical Activity: Not on file  Stress: Not on file  Social Connections: Not on file     Family History: The patient's family history includes Hypertension in her mother.  ROS:   Please see the history of present illness.    Review of Systems  Constitutional: Positive for malaise/fatigue. Negative for chills and fever.  HENT: Negative for hearing loss.   Eyes: Negative for blurred vision and redness.  Respiratory: Negative for shortness of breath.   Cardiovascular: Negative for chest pain, palpitations, orthopnea, claudication, leg  swelling and PND.  Gastrointestinal: Negative for heartburn, melena and nausea.  Genitourinary: Negative for dysuria.  Musculoskeletal: Negative for falls and myalgias.  Neurological: Negative for dizziness and loss of consciousness.  Endo/Heme/Allergies: Negative for polydipsia.  Psychiatric/Behavioral: Negative for substance abuse.    EKGs/Labs/Other Studies Reviewed:    The following studies were reviewed today: No cardiac studies  Recent Labs: 09/07/2020: BUN 25; Creatinine, Ser 1.11; Potassium 4.5; Sodium 138  Recent Lipid Panel No results found for:  CHOL, TRIG, HDL, CHOLHDL, VLDL, LDLCALC, LDLDIRECT    Physical Exam:    VS:  There were no vitals taken for this visit.    Wt Readings from Last 3 Encounters:  08/31/20 129 lb 3.2 oz (58.6 kg)  08/02/20 128 lb 12.8 oz (58.4 kg)  07/29/18 132 lb 12.8 oz (60.2 kg)     GEN:  Well nourished, well developed in no acute distress HEENT: Normal NECK: No JVD; No carotid bruits CARDIAC: RRR, no murmurs, rubs, gallops RESPIRATORY:  Clear to auscultation without rales, wheezing or rhonchi  ABDOMEN: Soft, non-tender, non-distended MUSCULOSKELETAL:  No edema; No deformity  SKIN: Warm and dry NEUROLOGIC:  Alert and oriented x 3 PSYCHIATRIC:  Normal affect   ASSESSMENT:    No diagnosis found. PLAN:    In order of problems listed above:  #HTN: Better controlled. Has multiple allergies to blood pressure medications including amlodipine and HCTZ. Cannot tolerate BB due to bradycardia. Hydralazine made her feel tired with occasional HA and we are trialing off the medication. -Continue telmisartan 80mg  in the morning -Continue spironolactone to 25mg  in the AM (BP is highest in late morning and afternoon) and 25mg  in the evening -Stopped hydralazine for now due to fatigue and HA; willing to re-trial if spironolactone does not work -Keep blood pressure log for to review -On low Na diet  #HLD: LDL 99. Not on medications. Diet controlled. -Diet controlled   Medication Adjustments/Labs and Tests Ordered: Current medicines are reviewed at length with the patient today.  Concerns regarding medicines are outlined above.  No orders of the defined types were placed in this encounter.  No orders of the defined types were placed in this encounter.   There are no Patient Instructions on file for this visit.   Signed, , MD  09/25/2020 2:15 PM    [facility]

## 2020-09-30 ENCOUNTER — Other Ambulatory Visit: Payer: Self-pay

## 2020-09-30 ENCOUNTER — Encounter: Payer: Self-pay | Admitting: Cardiology

## 2020-09-30 ENCOUNTER — Ambulatory Visit: Payer: Medicare PPO | Admitting: Cardiology

## 2020-09-30 VITALS — BP 132/80 | HR 65 | Ht 64.5 in | Wt 127.0 lb

## 2020-09-30 DIAGNOSIS — I10 Essential (primary) hypertension: Secondary | ICD-10-CM | POA: Diagnosis not present

## 2020-09-30 DIAGNOSIS — R739 Hyperglycemia, unspecified: Secondary | ICD-10-CM | POA: Diagnosis not present

## 2020-09-30 DIAGNOSIS — E782 Mixed hyperlipidemia: Secondary | ICD-10-CM | POA: Diagnosis not present

## 2020-09-30 NOTE — Progress Notes (Signed)
Cardiology Office Note:    Date:  09/30/2020   ID:  Marie, Raymond 19-Apr-1941, MRN [id number]  PCP:  Lester McLean, NP   [facility]  Cardiologist:  Kristeen Miss, MD  Advanced Practice Provider:  No care team member to display Electrophysiologist:  None   Referring MD: Lester Tarnov, NP    History of Present Illness:    Marie Raymond is a 80 y.o. female with a hx of asthma, CKD III, HTN, HLD, rheumatoid arthritis, and GERD who presents to clinic for follow-up of HTN.  The patient initially seen by Dr. Elease Hashimoto in 07/2018 for blood pressure. At that time, she was doing well with her medications, exercise and diet and no changes were made.   She returned to clinic 08/02/20 to see me due to labile blood pressures at home with Bps running as high 170s/90s. We started spironolactone in addition to telmisartan at that time. Blood pressure log with persistently high readings in 140s and hydralazine added (has multiple medication intolerances). Now returning for follow-up.  During last visit on 08/31/20, the patient was complaining of fatigue on hydralazine. Blood pressures were better controlled but she was hoping to trial off the medication. We increased her spirolactone to 25mg  BID and stopped the hydralazine. She now returns to clinic.  Today, she is accompanied by her husband and reports that she is doing well at this time. She is tolerating her spironolactone 25mg  BID. Her blood pressure ranges from 120s/60-70s consistently now.She is still having headaches but she thinks it is related to increasing her sugar intake.  She is still active and has no exertional SOB, chest pain, tightness, or pressure. She denies having any PND, orthopnea, lightheadedness, nausea, or LE swelling.   Past Medical History:  Diagnosis Date  . Asthma   . Chronic kidney disease, stage III (moderate) (HCC)   . Diastolic heart failure (HCC)   . GERD (gastroesophageal reflux  disease)   . Gout   . Hyperlipidemia   . Hypertension   . 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 Irritable bowel syndrome   . Nephrolithiasis   . Prostatic hypertrophy    BENIGN  . Pulmonary embolism (HCC)   . Rheumatoid arthritis (HCC)   . Vitamin D deficiency     History reviewed. No pertinent surgical history.  Current Medications: Current Meds  Medication Sig  . Calcium Carbonate-Vitamin D (OSCAL 500/200 D-3 PO) Take 2 tablets by mouth daily.  . Multiple Vitamin (MULTIVITAMIN) tablet Take 1 tablet by mouth daily.  . Multiple Vitamins-Minerals (PRESERVISION AREDS PO) Take 2 tablets by mouth daily.  . Omega-3 Fatty Acids (FISH OIL PO) Take 2 tablets by mouth daily.  spironolactone (ALDACTONE) 25 MG tablet Take 1 tablet (25 mg total) by mouth 2 (two) times daily.  telmisartan (MICARDIS) 80 MG tablet Take 1 tablet (80 mg total) by mouth daily. Further refills to Primary Care Provider     Allergies:   Ibuprofen, Amlodipine, Codeine, Hydrochlorothiazide, Nsaids, and Pneumococcal vaccines   Social History   Socioeconomic History  . Marital status: Married    Spouse name: Not on file  . Number of children: Not on file  . Years of education: Not on file  . Highest education level: Not on file  Occupational History  . Not on file  Tobacco Use  . Smoking status: Never Smoker  . Smokeless tobacco: Never Used  Vaping Use  . Vaping Use: Never used  Substance and  Sexual Activity  . Alcohol use: Never  . Drug use: Never  . Sexual activity: Not on file  Other Topics Concern  . Not on file  Social History Narrative  . Not on file   Social Determinants of Health   Financial Resource Strain: Not on file  Food Insecurity: Not on file  Transportation Needs: Not on file  Physical Activity: Not on file  Stress: Not on file  Social Connections: Not on file     Family History: The patient's family history includes Hypertension in her mother.  ROS:   Please see the history of present  illness.    Review of Systems  Constitutional: Negative for chills, fever and malaise/fatigue.  HENT: Negative for hearing loss.   Eyes: Negative for blurred vision and redness.  Respiratory: Negative for shortness of breath.   Cardiovascular: Negative for chest pain, palpitations, orthopnea, claudication, leg swelling and PND.  Gastrointestinal: Negative for heartburn, melena and nausea.  Genitourinary: Negative for dysuria.  Musculoskeletal: Negative for falls and myalgias.  Neurological: Negative for dizziness and loss of consciousness.  Endo/Heme/Allergies: Negative for polydipsia.  Psychiatric/Behavioral: Negative for substance abuse.    EKGs/Labs/Other Studies Reviewed:    EKG:  09/30/20-EKG was not ordered today  Recent Labs: 09/07/2020: BUN 25; Creatinine, Ser 1.11; Potassium 4.5; Sodium 138  Recent Lipid Panel No results found for: CHOL, TRIG, HDL, CHOLHDL, VLDL, LDLCALC, LDLDIRECT    Physical Exam:    VS:  BP 132/80   Pulse 65   Ht 5' 4.5" (1.638 m)   Wt 127 lb (57.6 kg)   SpO2 99%   BMI 21.46 kg/m     Wt Readings from Last 3 Encounters:  09/30/20 127 lb (57.6 kg)  08/31/20 129 lb 3.2 oz (58.6 kg)  08/02/20 128 lb 12.8 oz (58.4 kg)     GEN:  Well nourished, well developed in no acute distress HEENT: Normal NECK: No JVD; No carotid bruits CARDIAC: RRR, no murmurs, rubs, gallops RESPIRATORY:  Clear to auscultation without rales, wheezing or rhonchi  ABDOMEN: Soft, non-tender, non-distended MUSCULOSKELETAL:  No edema; No deformity  SKIN: Warm and dry NEUROLOGIC:  Alert and oriented x 3 PSYCHIATRIC:  Normal affect   ASSESSMENT:    1. Hypertension, unspecified type   2. Elevated blood sugar   3. Mixed hyperlipidemia    PLAN:    In order of problems listed above:  #HTN: Controlled. Has multiple allergies to blood pressure medications including amlodipine and HCTZ. Cannot tolerate BB due to bradycardia. Hydralazine made her feel tired with occasional  HA. -Continue telmisartan 80mg  in the morning -Continue spironolactone to 25mg  in the AM (BP is highest in late morning and afternoon) and 25mg  in the evening -Stopped hydralazine for now due to fatigue and HA; willing to re-trial if spironolactone does not work -On low Na diet -Repeat BMET   #HLD: LDL 99. Not on medications. Diet controlled. -Diet controlled -Repeat lipids  #Imparied fasting glucose: -Check A1C -Has PCP follow-up in September   Medication Adjustments/Labs and Tests Ordered: Current medicines are reviewed at length with the patient today.  Concerns regarding medicines are outlined above.  Orders Placed This Encounter  Procedures  . HgB A1c  . Basic metabolic panel  . Lipid Profile   No orders of the defined types were placed in this encounter.   Patient Instructions  Medication Instructions:   Your physician recommends that you continue on your current medications as directed. Please refer to the Current Medication list given  to you today.  *If you need a refill on your cardiac medications before your next appointment, please call your pharmacy*   Lab Work:  IN THE VERY NEAR FUTURE AT ANY [facility]--THEY WILL CHECK BMET, HEMOGLOBIN A1C, AND LIPIDS--PLEASE GO FASTING TO THAT LAB APPOINTMENT  If you have labs (blood work) drawn today and your tests are completely normal, you will receive your results only by: Marland Kitchen MyChart Message (if you have MyChart) OR . A paper copy in the mail If you have any lab test that is abnormal or we need to change your treatment, we will call you to review the results.   Follow-Up: At [facility], you and your health needs are our priority.  As part of our continuing mission to provide you with exceptional heart care, we have created designated Provider Care Teams.  These Care Teams include your primary Cardiologist (physician) and Advanced Practice Providers (APPs -  Physician Assistants and Nurse Practitioners) who all work  together to provide you with the care you need, when you need it.  We recommend signing up for the patient portal called "MyChart".  Sign up information is provided on this After Visit Summary.  MyChart is used to connect with patients for Virtual Visits (Telemedicine).  Patients are able to view lab/test results, encounter notes, upcoming appointments, etc.  Non-urgent messages can be sent to your provider as well.   To learn more about what you can do with MyChart, go to [email or web address].    Your next appointment:   1 year(s)  The format for your next appointment:   In Person  Provider:   Laurance Flatten, MD         I,Alexis Bryant,acting as a scribe for Meriam Sprague, MD.,have documented all relevant documentation on the behalf of Meriam Sprague, MD,as directed by  Meriam Sprague, MD while in the presence of Meriam Sprague, MD.  I, Meriam Sprague, MD, have reviewed all documentation for this visit. The documentation on 09/30/20 for the exam, diagnosis, procedures, and orders are all accurate and complete.  Signed, Meriam Sprague, MD  09/30/2020 8:34 AM    [facility]

## 2020-09-30 NOTE — Patient Instructions (Signed)
Medication Instructions:   Your physician recommends that you continue on your current medications as directed. Please refer to the Current Medication list given to you today.  *If you need a refill on your cardiac medications before your next appointment, please call your pharmacy*   Lab Work:  IN THE VERY NEAR FUTURE AT ANY [facility]--THEY WILL CHECK BMET, HEMOGLOBIN A1C, AND LIPIDS--PLEASE GO FASTING TO THAT LAB APPOINTMENT  If you have labs (blood work) drawn today and your tests are completely normal, you will receive your results only by: Marland Kitchen MyChart Message (if you have MyChart) OR . A paper copy in the mail If you have any lab test that is abnormal or we need to change your treatment, we will call you to review the results.   Follow-Up: At [facility], you and your health needs are our priority.  As part of our continuing mission to provide you with exceptional heart care, we have created designated Provider Care Teams.  These Care Teams include your primary Cardiologist (physician) and Advanced Practice Providers (APPs -  Physician Assistants and Nurse Practitioners) who all work together to provide you with the care you need, when you need it.  We recommend signing up for the patient portal called "MyChart".  Sign up information is provided on this After Visit Summary.  MyChart is used to connect with patients for Virtual Visits (Telemedicine).  Patients are able to view lab/test results, encounter notes, upcoming appointments, etc.  Non-urgent messages can be sent to your provider as well.   To learn more about what you can do with MyChart, go to [email or web address].    Your next appointment:   1 year(s)  The format for your next appointment:   In Person  Provider:   Laurance Flatten, MD

## 2020-10-02 LAB — BASIC METABOLIC PANEL
BUN/Creatinine Ratio: 22 (ref 12–28)
BUN: 26 mg/dL (ref 8–27)
CO2: 23 mmol/L (ref 20–29)
Calcium: 9.1 mg/dL (ref 8.7–10.3)
Chloride: 97 mmol/L (ref 96–106)
Creatinine, Ser: 1.17 mg/dL — ABNORMAL HIGH (ref 0.57–1.00)
Glucose: 108 mg/dL — ABNORMAL HIGH (ref 65–99)
Potassium: 5.1 mmol/L (ref 3.5–5.2)
Sodium: 134 mmol/L (ref 134–144)
eGFR: 47 mL/min/{1.73_m2} — ABNORMAL LOW (ref >59)

## 2020-10-02 LAB — LIPID PANEL
Chol/HDL Ratio: 4.3 ratio (ref 0.0–4.4)
Cholesterol, Total: 172 mg/dL (ref 100–199)
HDL: 40 mg/dL (ref >39)
LDL Chol Calc (NIH): 115 mg/dL — ABNORMAL HIGH (ref 0–99)
Triglycerides: 93 mg/dL (ref 0–149)
VLDL Cholesterol Cal: 17 mg/dL (ref 5–40)

## 2020-10-02 LAB — HEMOGLOBIN A1C
Est. average glucose Bld gHb Est-mCnc: 137 mg/dL
Hgb A1c MFr Bld: 6.4 % — ABNORMAL HIGH (ref 4.8–5.6)

## 2020-10-04 ENCOUNTER — Telehealth: Payer: Self-pay | Admitting: *Deleted

## 2020-10-04 DIAGNOSIS — E782 Mixed hyperlipidemia: Secondary | ICD-10-CM

## 2020-10-04 DIAGNOSIS — Z79899 Other long term (current) drug therapy: Secondary | ICD-10-CM

## 2020-10-04 DIAGNOSIS — E7800 Pure hypercholesterolemia, unspecified: Secondary | ICD-10-CM

## 2020-10-04 DIAGNOSIS — I10 Essential (primary) hypertension: Secondary | ICD-10-CM

## 2020-10-04 DIAGNOSIS — R739 Hyperglycemia, unspecified: Secondary | ICD-10-CM

## 2020-10-04 NOTE — Telephone Encounter (Signed)
-----   Message from Meriam Sprague, MD sent at 10/03/2020 11:28 PM EDT ----- Her Cr and electrolytes are overall stable. We need to ensure she is staying well hydrated as her potassium is on the higher end and we want to ensure her GFR remains stable.  Her LDL is a little above goal at 115 (her goal is <100). We can do a 3 month trial of dietary and lifestyle changes and re-check afterwards. If still elevated, would recommend a low dose crestor 5mg  daily.  Her hemoglobin A1C is 6.4. She is prediabetic and we will really need to watch this going forward. Again, diet and lifestyle changes including watching her sugar/carb intake will help prevent this from worsening.   Can we repeat cholesterol and BMET in 3 months? Just want to make sure we are where we need to be.

## 2020-10-04 NOTE — Telephone Encounter (Signed)
Pt made aware of lab results and recommendations per Dr. Shari Prows.  Pt education provided about diet and lifestyle changes to help lower her LDL.  Pt advised to hydrate better.  Pt will have labs redrawn to check a BMET and lipids in 3 months, at [place] [facility] near her.  Orders placed and released in the system. Pt is aware to go fasting to that lab appt at [facility] around 7/25, to have these checked.  She is requesting a copy of her results to be mailed to her address on file.  Will mail these out.  Pt verbalized understanding and agrees with this plan.

## 2020-10-07 ENCOUNTER — Encounter: Payer: Self-pay | Admitting: *Deleted

## 2021-01-20 ENCOUNTER — Telehealth: Payer: Self-pay | Admitting: Cardiology

## 2021-01-20 MED ORDER — TELMISARTAN 80 MG PO TABS: 80.0000 mg | ORAL_TABLET | Freq: Every day | ORAL | 3 refills | Status: DC

## 2021-01-20 NOTE — Telephone Encounter (Signed)
*  STAT* If patient is at the pharmacy, call can be transferred to refill team.   1. Which medications need to be refilled? (please list name of each medication and dose if known) new prescriptions- the doctor who wrote these prescriptions have retired-  Telmisartan and Spironolactone  2. Which pharmacy/location (including street and city if local pharmacy) is medication to be sent to? [facility] [employer], [place]  3. Do they need a 30 day or 90 day supply? 90 days and refills

## 2021-01-20 NOTE — Telephone Encounter (Signed)
[facility] per pt request. Spironolactone has already been sent.

## 2021-09-07 ENCOUNTER — Other Ambulatory Visit: Payer: Self-pay | Admitting: *Deleted

## 2021-09-07 MED ORDER — SPIRONOLACTONE 25 MG PO TABS: 25.0000 mg | ORAL_TABLET | Freq: Two times a day (BID) | ORAL | 3 refills | Status: DC

## 2021-09-19 NOTE — Progress Notes (Deleted)
?Cardiology Office Note:   ? ?Date:  09/26/2021  ? ?Marie Raymond, DOB 1941-01-22, MRN [id number] ? ?PCP:  Lester [place], NP ?  ?[facility]  ?Cardiologist:  Kristeen Miss, MD  ?Advanced Practice Provider:  No care team member to display ?Electrophysiologist:  None  ? ?Referring MD: Lester [place], NP  ? ? ?History of Present Illness:   ? ?Marie Raymond is a 81 y.o. female with a hx of asthma, CKD III, HTN, HLD, rheumatoid arthritis, and GERD who presents to clinic for follow-up. ?  ?The patient initially seen by Dr. Elease Hashimoto in 07/2018 for blood pressure. At that time, she was doing well with her medications, exercise and diet and no changes were made.  ? ?She returned to clinic 08/02/20 to see me due to labile blood pressures at home with Bps running as high 170s/90s. We started spironolactone in addition to telmisartan at that time. Blood pressure log with persistently high readings in 140s and hydralazine added (has multiple medication intolerances). Now returning for follow-up. ? ?During last visit on 08/31/20, the patient was complaining of fatigue on hydralazine. Blood pressures were better controlled but she was hoping to trial off the medication. We increased her spirolactone to 25mg  BID and stopped the hydralazine. She now returns to clinic. ? ?Was last seen in clinic 09/2020 where she was doing well. Blood pressures controlled.  ? ?Today, *** ? ?Past Medical History:  ?Diagnosis Date  ? Asthma   ? Chronic kidney disease, stage III (moderate) (HCC)   ? Diastolic heart failure (HCC)   ? GERD (gastroesophageal reflux disease)   ? Gout   ? Hyperlipidemia   ? Hypertension   ? 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 Irritable bowel syndrome   ? Nephrolithiasis   ? Prostatic hypertrophy   ? BENIGN  ? Pulmonary embolism (HCC)   ? Rheumatoid arthritis (HCC)   ? Vitamin D deficiency   ? ? ?No past surgical history on file. ? ?Current Medications: ?No outpatient medications have been marked as taking  for the 09/27/21 encounter (Appointment) with 09/29/21, MD.  ?  ? ?Allergies:   Ibuprofen, Amlodipine, Codeine, Hydrochlorothiazide, Nsaids, and Pneumococcal vaccines  ? ?Social History  ? ?Socioeconomic History  ? Marital status: Married  ?  Spouse name: Not on file  ? Number of children: Not on file  ? Years of education: Not on file  ? Highest education level: Not on file  ?Occupational History  ? Not on file  ?Tobacco Use  ? Smoking status: Never  ? Smokeless tobacco: Never  ?Vaping Use  ? Vaping Use: Never used  ?Substance and Sexual Activity  ? Alcohol use: Never  ? Drug use: Never  ? Sexual activity: Not on file  ?Other Topics Concern  ? Not on file  ?Social History Narrative  ? Not on file  ? ?Social Determinants of Health  ? ?Financial Resource Strain: Not on file  ?Food Insecurity: Not on file  ?Transportation Needs: Not on file  ?Physical Activity: Not on file  ?Stress: Not on file  ?Social Connections: Not on file  ?  ? ?Family History: ?The patient's family history includes Hypertension in her mother. ? ?ROS:   ?Please see the history of present illness.    ?Review of Systems  ?Constitutional:  Negative for chills, fever and malaise/fatigue.  ?HENT:  Negative for hearing loss.   ?Eyes:  Negative for blurred vision and redness.  ?Respiratory:  Negative for shortness of breath.   ?Cardiovascular:  Negative for chest pain, palpitations, orthopnea, claudication, leg swelling and PND.  ?Gastrointestinal:  Negative for heartburn, melena and nausea.  ?Genitourinary:  Negative for dysuria.  ?Musculoskeletal:  Negative for falls and myalgias.  ?Neurological:  Negative for dizziness and loss of consciousness.  ?Endo/Heme/Allergies:  Negative for polydipsia.  ?Psychiatric/Behavioral:  Negative for substance abuse.   ? ?EKGs/Labs/Other Studies Reviewed:   ? ?EKG:  ?09/30/20-EKG was not ordered today ? ?Recent Labs: ?10/01/2020: BUN 26; Creatinine, Ser 1.17; Potassium 5.1; Sodium 134  ?Recent Lipid Panel ?    ?Component Value Date/Time  ? CHOL 172 10/01/2020 0718  ? TRIG 93 10/01/2020 0718  ? HDL 40 10/01/2020 0718  ? CHOLHDL 4.3 10/01/2020 0718  ? LDLCALC 115 (H) 10/01/2020 8527  ? ? ? ? ?Physical Exam:   ? ?VS:  There were no vitals taken for this visit.   ? ?Wt Readings from Last 3 Encounters:  ?09/30/20 127 lb (57.6 kg)  ?08/31/20 129 lb 3.2 oz (58.6 kg)  ?08/02/20 128 lb 12.8 oz (58.4 kg)  ?  ? ?GEN:  Well nourished, well developed in no acute distress ?HEENT: Normal ?NECK: No JVD; No carotid bruits ?CARDIAC: RRR, no murmurs, rubs, gallops ?RESPIRATORY:  Clear to auscultation without rales, wheezing or rhonchi  ?ABDOMEN: Soft, non-tender, non-distended ?MUSCULOSKELETAL:  No edema; No deformity  ?SKIN: Warm and dry ?NEUROLOGIC:  Alert and oriented x 3 ?PSYCHIATRIC:  Normal affect  ? ?ASSESSMENT:   ? ?No diagnosis found. ? ?PLAN:   ? ?In order of problems listed above: ? ?#HTN: ?Controlled. Has multiple allergies to blood pressure medications including amlodipine and HCTZ. Cannot tolerate BB due to bradycardia. Hydralazine made her feel tired with occasional HA. ?-Continue telmisartan 80mg  in the morning ?-Continue spironolactone to 25mg  in the AM (BP is highest in late morning and afternoon) and 25mg  in the evening ?-Stopped hydralazine for now due to fatigue and HA; willing to re-trial if spironolactone does not work ?-On low Na diet ?  ?#HLD: ?LDL 99. Not on medications. Diet controlled. ?-Diet controlled ?-Repeat lipids ? ?#Imparied fasting glucose: ?-Check A1C ?-Has PCP follow-up in September ?  ? ?Medication Adjustments/Labs and Tests Ordered: ?Current medicines are reviewed at length with the patient today.  Concerns regarding medicines are outlined above.  ?No orders of the defined types were placed in this encounter. ? ?No orders of the defined types were placed in this encounter. ? ? ?There are no Patient Instructions on file for this visit. ?  ? ? ?Signed, ? , MD  ?09/26/2021 4:14 PM     ?[facility] ?

## 2021-09-27 ENCOUNTER — Ambulatory Visit: Payer: Medicare PPO | Admitting: Cardiology

## 2021-09-27 ENCOUNTER — Encounter: Payer: Self-pay | Admitting: Cardiology

## 2021-09-27 VITALS — BP 128/68 | HR 62 | Ht 64.5 in | Wt 117.6 lb

## 2021-09-27 DIAGNOSIS — E7800 Pure hypercholesterolemia, unspecified: Secondary | ICD-10-CM | POA: Diagnosis not present

## 2021-09-27 DIAGNOSIS — R7303 Prediabetes: Secondary | ICD-10-CM | POA: Diagnosis not present

## 2021-09-27 DIAGNOSIS — I10 Essential (primary) hypertension: Secondary | ICD-10-CM

## 2021-09-27 DIAGNOSIS — E782 Mixed hyperlipidemia: Secondary | ICD-10-CM

## 2021-09-27 DIAGNOSIS — Z862 Personal history of diseases of the blood and blood-forming organs and certain disorders involving the immune mechanism: Secondary | ICD-10-CM

## 2021-09-27 LAB — LIPID PANEL
Chol/HDL Ratio: 3 ratio (ref 0.0–4.4)
Cholesterol, Total: 155 mg/dL (ref 100–199)
HDL: 52 mg/dL (ref >39)
LDL Chol Calc (NIH): 88 mg/dL (ref 0–99)
Triglycerides: 79 mg/dL (ref 0–149)
VLDL Cholesterol Cal: 15 mg/dL (ref 5–40)

## 2021-09-27 LAB — BASIC METABOLIC PANEL
BUN/Creatinine Ratio: 24 (ref 12–28)
BUN: 30 mg/dL — ABNORMAL HIGH (ref 8–27)
CO2: 24 mmol/L (ref 20–29)
Calcium: 9.3 mg/dL (ref 8.7–10.3)
Chloride: 99 mmol/L (ref 96–106)
Creatinine, Ser: 1.27 mg/dL — ABNORMAL HIGH (ref 0.57–1.00)
Glucose: 161 mg/dL — ABNORMAL HIGH (ref 70–99)
Potassium: 4.9 mmol/L (ref 3.5–5.2)
Sodium: 136 mmol/L (ref 134–144)
eGFR: 43 mL/min/{1.73_m2} — ABNORMAL LOW (ref >59)

## 2021-09-27 LAB — CBC
Hematocrit: 33.7 % — ABNORMAL LOW (ref 34.0–46.6)
Hemoglobin: 11.2 g/dL (ref 11.1–15.9)
MCH: 30.3 pg (ref 26.6–33.0)
MCHC: 33.2 g/dL (ref 31.5–35.7)
MCV: 91 fL (ref 79–97)
Platelets: 250 10*3/uL (ref 150–450)
RBC: 3.7 x10E6/uL — ABNORMAL LOW (ref 3.77–5.28)
RDW: 13.1 % (ref 11.7–15.4)
WBC: 5.2 10*3/uL (ref 3.4–10.8)

## 2021-09-27 LAB — HEMOGLOBIN A1C
Est. average glucose Bld gHb Est-mCnc: 143 mg/dL
Hgb A1c MFr Bld: 6.6 % — ABNORMAL HIGH (ref 4.8–5.6)

## 2021-09-27 MED ORDER — TELMISARTAN 80 MG PO TABS: 80.0000 mg | ORAL_TABLET | Freq: Every day | ORAL | 3 refills | Status: AC

## 2021-09-27 MED ORDER — SPIRONOLACTONE 25 MG PO TABS: 25.0000 mg | ORAL_TABLET | Freq: Every day | ORAL | 3 refills | Status: AC

## 2021-09-27 NOTE — Patient Instructions (Signed)
Medication Instructions:  ? ?PLEASE TAKE SPIRONOLACTONE 25 MG BY MOUTH DAILY--TAKE IN THE MORNING TIME ? ?*If you need a refill on your cardiac medications before your next appointment, please call your pharmacy* ? ? ?Lab Work: ? ?TODAY--CBC, BMET, HEMOGLOBIN A1C, AND LIPIDS ? ?If you have labs (blood work) drawn today and your tests are completely normal, you will receive your results only by: ?MyChart Message (if you have MyChart) OR ?A paper copy in the mail ?If you have any lab test that is abnormal or we need to change your treatment, we will call you to review the results. ? ? ?Follow-Up: ?At [facility], you and your health needs are our priority.  As part of our continuing mission to provide you with exceptional heart care, we have created designated Provider Care Teams.  These Care Teams include your primary Cardiologist (physician) and Advanced Practice Providers (APPs -  Physician Assistants and Nurse Practitioners) who all work together to provide you with the care you need, when you need it. ? ?We recommend signing up for the patient portal called "MyChart".  Sign up information is provided on this After Visit Summary.  MyChart is used to connect with patients for Virtual Visits (Telemedicine).  Patients are able to view lab/test results, encounter notes, upcoming appointments, etc.  Non-urgent messages can be sent to your provider as well.   ?To learn more about what you can do with MyChart, go to [email or web address].   ? ?Your next appointment:   ?1 year(s) ? ?The format for your next appointment:   ?In Person ? ?Provider:   ?DR Johney Frame ? ?Important Information About Sugar ? ? ? ? ? ? ?

## 2021-09-27 NOTE — Progress Notes (Signed)
?Cardiology Office Note:   ? ?Date:  09/27/2021  ? ?Marie Raymond, DOB 02/20/1941, MRN [id number] ? ?PCP:  Lester [place], NP ?  ?[facility]  ?Cardiologist:  Kristeen Miss, MD  ?Advanced Practice Provider:  No care team member to display ?Electrophysiologist:  None  ? ?Referring MD: Lester [place], NP  ? ? ?History of Present Illness:   ? ?Marie Raymond is a 81 y.o. female with a hx of asthma, CKD III, HTN, HLD, rheumatoid arthritis, and GERD who presents to clinic for follow-up. ?  ?The patient initially seen by Dr. Elease Hashimoto in 07/2018 for blood pressure. At that time, she was doing well with her medications, exercise and diet and no changes were made.  ? ?She returned to clinic 08/02/20 to see me due to labile blood pressures at home with Bps running as high 170s/90s. We started spironolactone in addition to telmisartan at that time. Blood pressure log with persistently high readings in 140s and hydralazine added (has multiple medication intolerances). Now returning for follow-up. ? ?During last visit on 08/31/20, the patient was complaining of fatigue on hydralazine. Blood pressures were better controlled but she was hoping to trial off the medication. We increased her spirolactone to 25mg  BID and stopped the hydralazine. She now returns to clinic. ? ?Was last seen in clinic 09/2020 where she was doing well. Blood pressures controlled.  ? ?Today, she is doing well and accompanied by her husband. She takes 25 mg spironolactone in the morning with telmisartan but skips her evening dose because it was making her feel poorly. Her latest blood pressure measurement at home was 127/70. She denies chest pain, chest pressure, dyspnea at rest or with exertion, palpitations, PND, orthopnea, or leg swelling. States she would like to minimize medications and imaging.  ? ?Past Medical History:  ?Diagnosis Date  ? Asthma   ? Chronic kidney disease, stage III (moderate) (HCC)   ? Diastolic heart  failure (HCC)   ? GERD (gastroesophageal reflux disease)   ? Gout   ? Hyperlipidemia   ? Hypertension   ? 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 Irritable bowel syndrome   ? Nephrolithiasis   ? Prostatic hypertrophy   ? BENIGN  ? Pulmonary embolism (HCC)   ? Rheumatoid arthritis (HCC)   ? Vitamin D deficiency   ? ? ?No past surgical history on file. ? ?Current Medications: ?Current Meds  ?Medication Sig  ? Calcium Carbonate-Vitamin D (OSCAL 500/200 D-3 PO) Take 2 tablets by mouth daily.  ? Multiple Vitamin (MULTIVITAMIN) tablet Take 1 tablet by mouth daily.  ? Multiple Vitamins-Minerals (PRESERVISION AREDS PO) Take 2 tablets by mouth daily.  ? Omega-3 Fatty Acids (FISH OIL PO) Take 2 tablets by mouth daily.  ? spironolactone (ALDACTONE) 25 MG tablet Take 1 tablet (25 mg total) by mouth daily.  ? [DISCONTINUED] spironolactone (ALDACTONE) 25 MG tablet Take 1 tablet (25 mg total) by mouth 2 (two) times daily.  ? [DISCONTINUED] telmisartan (MICARDIS) 80 MG tablet Take 1 tablet (80 mg total) by mouth daily.  ?  ? ?Allergies:   Ibuprofen, Amlodipine, Codeine, Hydrochlorothiazide, Nsaids, and Pneumococcal vaccines  ? ?Social History  ? ?Socioeconomic History  ? Marital status: Married  ?  Spouse name: Not on file  ? Number of children: Not on file  ? Years of education: Not on file  ? Highest education level: Not on file  ?Occupational History  ? Not on file  ?Tobacco Use  ?  Smoking status: Never  ? Smokeless tobacco: Never  ?Vaping Use  ? Vaping Use: Never used  ?Substance and Sexual Activity  ? Alcohol use: Never  ? Drug use: Never  ? Sexual activity: Not on file  ?Other Topics Concern  ? Not on file  ?Social History Narrative  ? Not on file  ? ?Social Determinants of Health  ? ?Financial Resource Strain: Not on file  ?Food Insecurity: Not on file  ?Transportation Needs: Not on file  ?Physical Activity: Not on file  ?Stress: Not on file  ?Social Connections: Not on file  ?  ? ?Family History: ?The patient's family history includes  Hypertension in her mother. ? ?ROS:   ?Please see the history of present illness.    ?Review of Systems  ?Constitutional:  Negative for chills, fever and malaise/fatigue.  ?HENT:  Negative for hearing loss.   ?Eyes:  Negative for blurred vision and redness.  ?Respiratory:  Negative for shortness of breath.   ?Cardiovascular:  Negative for chest pain, palpitations, orthopnea, claudication, leg swelling and PND.  ?Gastrointestinal:  Negative for heartburn, melena and nausea.  ?Genitourinary:  Negative for dysuria.  ?Musculoskeletal:  Negative for falls and myalgias.  ?Neurological:  Negative for dizziness and loss of consciousness.  ?Endo/Heme/Allergies:  Negative for polydipsia.  ?Psychiatric/Behavioral:  Negative for substance abuse.   ? ?EKGs/Labs/Other Studies Reviewed:   ? ? ?EKG:  ?09/27/21: Sinus rhythm, rate 62 bpm; RBBB ? ?Recent Labs: ?10/01/2020: BUN 26; Creatinine, Ser 1.17; Potassium 5.1; Sodium 134  ?Recent Lipid Panel ?   ?Component Value Date/Time  ? CHOL 172 10/01/2020 0718  ? TRIG 93 10/01/2020 0718  ? HDL 40 10/01/2020 0718  ? CHOLHDL 4.3 10/01/2020 0718  ? LDLCALC 115 (H) 10/01/2020 16100718  ? ? ? ? ?Physical Exam:   ? ?VS:  BP 128/68   Pulse 62   Ht 5' 4.5" (1.638 m)   Wt 117 lb 9.6 oz (53.3 kg)   SpO2 95%   BMI 19.87 kg/m?    ? ?Wt Readings from Last 3 Encounters:  ?09/27/21 117 lb 9.6 oz (53.3 kg)  ?09/30/20 127 lb (57.6 kg)  ?08/31/20 129 lb 3.2 oz (58.6 kg)  ?  ? ?GEN:  Well nourished, well developed in no acute distress ?HEENT: Normal ?NECK: No JVD; No carotid bruits ?CARDIAC: RRR, no murmurs, rubs, gallops ?RESPIRATORY:  Clear to auscultation without rales, wheezing or rhonchi  ?ABDOMEN: Soft, non-tender, non-distended ?MUSCULOSKELETAL:  No edema; No deformity  ?SKIN: Warm and dry ?NEUROLOGIC:  Alert and oriented x 3 ?PSYCHIATRIC:  Normal affect  ? ?ASSESSMENT:   ? ?1. Mixed hyperlipidemia   ?2. Elevated LDL cholesterol level   ?3. Hypertension, unspecified type   ?4. Prediabetes   ?5.  History of anemia   ? ? ?PLAN:   ? ?In order of problems listed above: ? ?#HTN: ?Controlled. Has multiple allergies to blood pressure medications including amlodipine and HCTZ. Cannot tolerate BB due to bradycardia. Hydralazine made her feel tired with occasional HA. ?-Continue telmisartan 80mg  in the morning ?-Continue spironolactone to 25mg  in the AM; stop evening dose due to low BP ?-Stopped hydralazine for now due to fatigue and HA; willing to re-trial if spironolactone does not work ?-On low Na diet ?  ?#HLD: ?LDL 115. Does not want statin therapy if she can avoid it and declined Ca score.  ?-Diet controlled ?-Repeat lipids ? ?#Pre-DMII: ?Last A1C 6.4.  ?-Repeat today ? ?#CKDIIIa: ?-Repeat BMET for monitoring ? ?#History of anemia: ?Hemoglobin  9.5 in 2020. Does not currently have PCP. Will check CBC for monitoring. ?-Check CBC ?  ? ?Medication Adjustments/Labs and Tests Ordered: ?Current medicines are reviewed at length with the patient today.  Concerns regarding medicines are outlined above.  ?Orders Placed This Encounter  ?Procedures  ? Basic metabolic panel  ? CBC  ? HgB A1c  ? Lipid Profile  ? EKG 12-Lead  ? ?Meds ordered this encounter  ?Medications  ? spironolactone (ALDACTONE) 25 MG tablet  ?  Sig: Take 1 tablet (25 mg total) by mouth daily.  ?  Dispense:  90 tablet  ?  Refill:  3  ?  Dose decrease  ? telmisartan (MICARDIS) 80 MG tablet  ?  Sig: Take 1 tablet (80 mg total) by mouth daily.  ?  Dispense:  90 tablet  ?  Refill:  3  ? ? ?Patient Instructions  ?Medication Instructions:  ? ?PLEASE TAKE SPIRONOLACTONE 25 MG BY MOUTH DAILY--TAKE IN THE MORNING TIME ? ?*If you need a refill on your cardiac medications before your next appointment, please call your pharmacy* ? ? ?Lab Work: ? ?TODAY--CBC, BMET, HEMOGLOBIN A1C, AND LIPIDS ? ?If you have labs (blood work) drawn today and your tests are completely normal, you will receive your results only by: ?MyChart Message (if you have MyChart) OR ?A paper copy in  the mail ?If you have any lab test that is abnormal or we need to change your treatment, we will call you to review the results. ? ? ?Follow-Up: ?At [facility], you and your health needs are our priority.  As p

## 2021-09-28 ENCOUNTER — Encounter: Payer: Self-pay | Admitting: Cardiology

## 2021-09-28 DIAGNOSIS — E7800 Pure hypercholesterolemia, unspecified: Secondary | ICD-10-CM

## 2021-09-28 DIAGNOSIS — R7303 Prediabetes: Secondary | ICD-10-CM

## 2021-11-29 LAB — HEMOGLOBIN A1C
Est. average glucose Bld gHb Est-mCnc: 123 mg/dL
Hgb A1c MFr Bld: 5.9 % — ABNORMAL HIGH (ref 4.8–5.6)
